# Patient Record
Sex: Female | Born: 1965 | Race: Black or African American | Hispanic: No | Marital: Single | Smoking: Current some day smoker
Health system: Southern US, Community
[De-identification: ages and names within clinical notes are randomized; demographics above are authoritative.]

## PROBLEM LIST (undated history)

## (undated) DIAGNOSIS — D649 Anemia, unspecified: Secondary | ICD-10-CM

## (undated) DIAGNOSIS — E785 Hyperlipidemia, unspecified: Secondary | ICD-10-CM

## (undated) DIAGNOSIS — M1990 Unspecified osteoarthritis, unspecified site: Secondary | ICD-10-CM

## (undated) DIAGNOSIS — K5792 Diverticulitis of intestine, part unspecified, without perforation or abscess without bleeding: Secondary | ICD-10-CM

## (undated) DIAGNOSIS — H269 Unspecified cataract: Secondary | ICD-10-CM

## (undated) DIAGNOSIS — K635 Polyp of colon: Secondary | ICD-10-CM

## (undated) HISTORY — PX: BACK SURGERY: SHX140

## (undated) HISTORY — PX: ABDOMINAL HYSTERECTOMY: SHX81

## (undated) HISTORY — DX: Unspecified cataract: H26.9

## (undated) HISTORY — PX: FOOT FRACTURE SURGERY: SHX645

## (undated) HISTORY — DX: Unspecified osteoarthritis, unspecified site: M19.90

## (undated) HISTORY — DX: Anemia, unspecified: D64.9

## (undated) HISTORY — DX: Hyperlipidemia, unspecified: E78.5

---

## 2016-02-18 ENCOUNTER — Emergency Department (HOSPITAL_COMMUNITY): Payer: Self-pay

## 2016-02-18 ENCOUNTER — Encounter (HOSPITAL_COMMUNITY): Payer: Self-pay

## 2016-02-18 ENCOUNTER — Emergency Department (HOSPITAL_COMMUNITY)
Admission: EM | Admit: 2016-02-18 | Discharge: 2016-02-18 | Disposition: A | Discharge status: Home / Self Care | Payer: Self-pay | Attending: Emergency Medicine | Admitting: Emergency Medicine

## 2016-02-18 DIAGNOSIS — Z86010 Personal history of colonic polyps: Secondary | ICD-10-CM | POA: Insufficient documentation

## 2016-02-18 DIAGNOSIS — F17210 Nicotine dependence, cigarettes, uncomplicated: Secondary | ICD-10-CM | POA: Insufficient documentation

## 2016-02-18 DIAGNOSIS — K5792 Diverticulitis of intestine, part unspecified, without perforation or abscess without bleeding: Secondary | ICD-10-CM | POA: Insufficient documentation

## 2016-02-18 DIAGNOSIS — K5900 Constipation, unspecified: Secondary | ICD-10-CM | POA: Insufficient documentation

## 2016-02-18 HISTORY — DX: Polyp of colon: K63.5

## 2016-02-18 LAB — CBC WITH DIFFERENTIAL/PLATELET
BASOS ABS: 0 10*3/uL (ref 0.0–0.1)
BASOS PCT: 0 %
Eosinophils Absolute: 0.2 10*3/uL (ref 0.0–0.7)
Eosinophils Relative: 1 %
HEMATOCRIT: 40.3 % (ref 36.0–46.0)
HEMOGLOBIN: 13.1 g/dL (ref 12.0–15.0)
LYMPHS PCT: 28 %
Lymphs Abs: 3.8 10*3/uL (ref 0.7–4.0)
MCH: 29 pg (ref 26.0–34.0)
MCHC: 32.5 g/dL (ref 30.0–36.0)
MCV: 89.4 fL (ref 78.0–100.0)
MONO ABS: 0.9 10*3/uL (ref 0.1–1.0)
Monocytes Relative: 7 %
NEUTROS ABS: 8.6 10*3/uL — AB (ref 1.7–7.7)
NEUTROS PCT: 64 %
Platelets: 385 10*3/uL (ref 150–400)
RBC: 4.51 MIL/uL (ref 3.87–5.11)
RDW: 13.9 % (ref 11.5–15.5)
WBC: 13.5 10*3/uL — ABNORMAL HIGH (ref 4.0–10.5)

## 2016-02-18 LAB — URINE MICROSCOPIC-ADD ON: WBC, UA: NONE SEEN WBC/hpf (ref 0–5)

## 2016-02-18 LAB — LIPASE, BLOOD: LIPASE: 28 U/L (ref 11–51)

## 2016-02-18 LAB — URINALYSIS, ROUTINE W REFLEX MICROSCOPIC
Bilirubin Urine: NEGATIVE
GLUCOSE, UA: NEGATIVE mg/dL
KETONES UR: NEGATIVE mg/dL
LEUKOCYTES UA: NEGATIVE
Nitrite: NEGATIVE
PH: 7 (ref 5.0–8.0)
PROTEIN: NEGATIVE mg/dL
Specific Gravity, Urine: 1.021 (ref 1.005–1.030)

## 2016-02-18 LAB — COMPREHENSIVE METABOLIC PANEL
ALBUMIN: 4.2 g/dL (ref 3.5–5.0)
ALK PHOS: 88 U/L (ref 38–126)
ALT: 23 U/L (ref 14–54)
AST: 24 U/L (ref 15–41)
Anion gap: 8 (ref 5–15)
BILIRUBIN TOTAL: 0.8 mg/dL (ref 0.3–1.2)
BUN: 6 mg/dL (ref 6–20)
CO2: 26 mmol/L (ref 22–32)
CREATININE: 0.63 mg/dL (ref 0.44–1.00)
Calcium: 9.9 mg/dL (ref 8.9–10.3)
Chloride: 102 mmol/L (ref 101–111)
GFR calc Af Amer: >60 mL/min (ref >60)
GLUCOSE: 105 mg/dL — AB (ref 65–99)
Potassium: 4.3 mmol/L (ref 3.5–5.1)
Sodium: 136 mmol/L (ref 135–145)
TOTAL PROTEIN: 8.2 g/dL — AB (ref 6.5–8.1)

## 2016-02-18 MED ORDER — MORPHINE SULFATE (PF) 4 MG/ML IV SOLN
4.0000 mg | Freq: Once | INTRAVENOUS | Status: AC
  Administered 2016-02-18: 4 mg via INTRAVENOUS
  Filled 2016-02-18: qty 1

## 2016-02-18 MED ORDER — HYDROCODONE-ACETAMINOPHEN 5-325 MG PO TABS: 1.0000 | ORAL_TABLET | Freq: Four times a day (QID) | ORAL | Status: DC | PRN

## 2016-02-18 MED ORDER — DIPHENHYDRAMINE HCL 50 MG/ML IJ SOLN
25.0000 mg | Freq: Once | INTRAMUSCULAR | Status: AC
  Administered 2016-02-18: 25 mg via INTRAVENOUS
  Filled 2016-02-18: qty 1

## 2016-02-18 MED ORDER — METRONIDAZOLE 500 MG PO TABS: 500.0000 mg | ORAL_TABLET | Freq: Three times a day (TID) | ORAL | Status: DC

## 2016-02-18 MED ORDER — ONDANSETRON HCL 4 MG PO TABS: 4.0000 mg | ORAL_TABLET | Freq: Four times a day (QID) | ORAL | Status: DC

## 2016-02-18 MED ORDER — METRONIDAZOLE 500 MG PO TABS
500.0000 mg | ORAL_TABLET | Freq: Once | ORAL | Status: AC
  Administered 2016-02-18: 500 mg via ORAL
  Filled 2016-02-18: qty 1

## 2016-02-18 MED ORDER — CIPROFLOXACIN HCL 500 MG PO TABS
500.0000 mg | ORAL_TABLET | Freq: Once | ORAL | Status: AC
  Administered 2016-02-18: 500 mg via ORAL
  Filled 2016-02-18: qty 1

## 2016-02-18 MED ORDER — IOPAMIDOL (ISOVUE-300) INJECTION 61%
75.0000 mL | Freq: Once | INTRAVENOUS | Status: AC | PRN
  Administered 2016-02-18: 75 mL via INTRAVENOUS

## 2016-02-18 MED ORDER — SODIUM CHLORIDE 0.9 % IV BOLUS (SEPSIS)
1000.0000 mL | Freq: Once | INTRAVENOUS | Status: AC
  Administered 2016-02-18: 1000 mL via INTRAVENOUS

## 2016-02-18 MED ORDER — CIPROFLOXACIN HCL 500 MG PO TABS: 500.0000 mg | ORAL_TABLET | Freq: Two times a day (BID) | ORAL | Status: DC

## 2016-02-18 MED FILL — ONDANSETRON HCL 4 MG TABLET: 4 | 3 days supply | Qty: 12 | Fill #0

## 2016-02-18 MED FILL — CIPROFLOXACIN HCL 500 MG TA: 500 | 10 days supply | Qty: 20 | Fill #0

## 2016-02-18 MED FILL — metroNIDAZOLE 500 MG TABS: 500 | 10 days supply | Qty: 30 | Fill #0

## 2016-02-18 NOTE — Discharge Instructions (Signed)
Diverticulitis °Diverticulitis is inflammation or infection of small pouches in your colon that form when you have a condition called diverticulosis. The pouches in your colon are called diverticula. Your colon, or large intestine, is where water is absorbed and stool is formed. °Complications of diverticulitis can include: °· Bleeding. °· Severe infection. °· Severe pain. °· Perforation of your colon. °· Obstruction of your colon. °CAUSES  °Diverticulitis is caused by bacteria. °Diverticulitis happens when stool becomes trapped in diverticula. This allows bacteria to grow in the diverticula, which can lead to inflammation and infection. °RISK FACTORS °People with diverticulosis are at risk for diverticulitis. Eating a diet that does not include enough fiber from fruits and vegetables may make diverticulitis more likely to develop. °SYMPTOMS  °Symptoms of diverticulitis may include: °· Abdominal pain and tenderness. The pain is normally located on the left side of the abdomen, but may occur in other areas. °· Fever and chills. °· Bloating. °· Cramping. °· Nausea. °· Vomiting. °· Constipation. °· Diarrhea. °· Blood in your stool. °DIAGNOSIS  °Your health care provider will ask you about your medical history and do a physical exam. You may need to have tests done because many medical conditions can cause the same symptoms as diverticulitis. Tests may include: °· Blood tests. °· Urine tests. °· Imaging tests of the abdomen, including X-rays and CT scans. °When your condition is under control, your health care provider may recommend that you have a colonoscopy. A colonoscopy can show how severe your diverticula are and whether something else is causing your symptoms. °TREATMENT  °Most cases of diverticulitis are mild and can be treated at home. Treatment may include: °· Taking over-the-counter pain medicines. °· Following a clear liquid diet. °· Taking antibiotic medicines by mouth for 7-10 days. °More severe cases may  be treated at a hospital. Treatment may include: °· Not eating or drinking. °· Taking prescription pain medicine. °· Receiving antibiotic medicines through an IV tube. °· Receiving fluids and nutrition through an IV tube. °· Surgery. °HOME CARE INSTRUCTIONS  °· Follow your health care provider's instructions carefully. °· Follow a full liquid diet or other diet as directed by your health care provider. After your symptoms improve, your health care provider may tell you to change your diet. He or she may recommend you eat a high-fiber diet. Fruits and vegetables are good sources of fiber. Fiber makes it easier to pass stool. °· Take fiber supplements or probiotics as directed by your health care provider. °· Only take medicines as directed by your health care provider. °· Keep all your follow-up appointments. °SEEK MEDICAL CARE IF:  °· Your pain does not improve. °· You have a hard time eating food. °· Your bowel movements do not return to normal. °SEEK IMMEDIATE MEDICAL CARE IF:  °· Your pain becomes worse. °· Your symptoms do not get better. °· Your symptoms suddenly get worse. °· You have a fever. °· You have repeated vomiting. °· You have bloody or black, tarry stools. °MAKE SURE YOU:  °· Understand these instructions. °· Will watch your condition. °· Will get help right away if you are not doing well or get worse. °  °This information is not intended to replace advice given to you by your health care provider. Make sure you discuss any questions you have with your health care provider. °  °Document Released: 06/24/2005 Document Revised: 09/19/2013 Document Reviewed: 08/09/2013 °Elsevier Interactive Patient Education ©2016 [employer] ° °

## 2016-02-18 NOTE — ED Notes (Signed)
Pt. Presents with complaint of L lower abd pain. Pt. States she was feeling constipated so took milk of magnesium x 2. Last bowel movement today. Pt. Denies N/V. States hx of same.

## 2016-02-18 NOTE — ED Provider Notes (Signed)
CSN: [id number]     Arrival date & time 02/18/16  [id number] History   First MD Initiated Contact with Patient 02/18/16 1035     Chief Complaint  Patient presents with  . Abdominal Pain   Patient is a 50 y.o. female presenting with abdominal pain.  Abdominal Pain Pain location:  LLQ Pain quality: aching, bloating and sharp   Pain radiates to:  Does not radiate Pain severity:  Moderate Onset quality:  Gradual Duration:  1 week Progression:  Worsening Chronicity:  Recurrent Relieved by:  Nothing Worsened by:  Movement Ineffective treatments: Milk of magnesia. Associated symptoms: constipation   Associated symptoms: no chills, no diarrhea, no dysuria, no fever, no hematochezia, no hematuria, no melena, no nausea, no vaginal discharge and no vomiting    Ms. Tatlock is a 50 year old female with PMHx of colon polyps presenting with abdominal pain. Onset of symptoms was one week ago. She complains of pain in the left lower quadrant that she describes as aching with intermittent sharp and shooting pain. She states the pain is constant and seems to be worsening in severity. The pain is worsened by certain movements and palpation. She denies alleviating factors. She initially felt constipated but took milk of magnesia which relieved this. This has not improved her abdominal pain. Denies blood in her stool. She reports a history of similar abdominal pain for which she took natural herbs which relieved the symptoms. She is concerned that her polyp is infected. She states that she had colonoscopy a few years ago and was told she had an large polyp that was removed. She believes that the site of the polyp removal is what is infected currently. Denies associated symptoms of fever, chills, nausea, vomiting, dysuria, hematuria or vaginal discharge. She has no other complaints today.   Past Medical History  Diagnosis Date  . Polyp of colon    Past Surgical History  Procedure Laterality Date  . Back surgery     2001  . Abdominal hysterectomy      partial  . Foot fracture surgery     No family history on file. Social History  Substance Use Topics  . Smoking status: Current Every Day Smoker -- 0.50 packs/day    Types: Cigarettes  . Smokeless tobacco: None  . Alcohol Use: Yes     Comment: socially   OB History    No data available     Review of Systems  Constitutional: Negative for fever and chills.  Gastrointestinal: Positive for abdominal pain and constipation. Negative for nausea, vomiting, diarrhea, melena and hematochezia.  Genitourinary: Negative for dysuria, hematuria and vaginal discharge.  All other systems reviewed and are negative.     Allergies  Review of patient's allergies indicates no known allergies.  Home Medications   Prior to Admission medications   Medication Sig Start Date End Date Taking? Authorizing Provider  ciprofloxacin (CIPRO) 500 MG tablet Take 1 tablet (500 mg total) by mouth 2 (two) times daily. 02/18/16   Ilina Xu, PA-C  HYDROcodone-acetaminophen (NORCO/VICODIN) 5-325 MG tablet Take 1 tablet by mouth every 6 (six) hours as needed. 02/18/16   Ansen Sayegh, PA-C  magnesium hydroxide (MILK OF MAGNESIA) 400 MG/5ML suspension Take 5 mLs by mouth daily as needed for mild constipation.   Yes Historical Provider, MD  metroNIDAZOLE (FLAGYL) 500 MG tablet Take 1 tablet (500 mg total) by mouth 3 (three) times daily. 02/18/16   Caralynn Gelber, PA-C  naproxen sodium (ANAPROX) 220 MG tablet Take 440  mg by mouth 2 (two) times daily as needed. For pain   Yes Historical Provider, MD  ondansetron (ZOFRAN) 4 MG tablet Take 1 tablet (4 mg total) by mouth every 6 (six) hours. 02/18/16   Ramia Sidney, PA-C   BP 100/69 mmHg  Pulse 78  Temp(Src) 98.9 F (37.2 C) (Oral)  Resp 18  SpO2 98% Physical Exam  Constitutional: She appears well-developed and well-nourished. No distress.  Nontoxic-appearing  HENT:  Head: Normocephalic and atraumatic.  Eyes: Conjunctivae are  normal. Right eye exhibits no discharge. Left eye exhibits no discharge. No scleral icterus.  Neck: Normal range of motion.  Cardiovascular: Normal rate, regular rhythm and normal heart sounds.   Pulmonary/Chest: Effort normal and breath sounds normal. No respiratory distress.  Abdominal: Soft. Bowel sounds are normal. There is tenderness in the left lower quadrant. There is no rebound and no guarding.  Musculoskeletal: Normal range of motion.  Neurological: She is alert. Coordination normal.  Skin: Skin is warm and dry.  Psychiatric: She has a normal mood and affect. Her behavior is normal.  Nursing note and vitals reviewed.   ED Course  Procedures (including critical care time) Labs Review Labs Reviewed  CBC WITH DIFFERENTIAL/PLATELET - Abnormal; Notable for the following:    WBC 13.5 (*)    Neutro Abs 8.6 (*)    All other components within normal limits  COMPREHENSIVE METABOLIC PANEL - Abnormal; Notable for the following:    Glucose, Bld 105 (*)    Total Protein 8.2 (*)    All other components within normal limits  URINALYSIS, ROUTINE W REFLEX MICROSCOPIC (NOT AT [facility]) - Abnormal; Notable for the following:    APPearance HAZY (*)    Hgb urine dipstick SMALL (*)    All other components within normal limits  URINE MICROSCOPIC-ADD ON - Abnormal; Notable for the following:    Squamous Epithelial / LPF 0-5 (*)    Bacteria, UA RARE (*)    All other components within normal limits  LIPASE, BLOOD    Imaging Review Ct Abdomen Pelvis W Contrast  02/18/2016  CLINICAL DATA:  Left upper/left lower quadrant pain with nausea for 1 week. History of diverticulitis. EXAM: CT ABDOMEN AND PELVIS WITH CONTRAST TECHNIQUE: Multidetector CT imaging of the abdomen and pelvis was performed using the standard protocol following bolus administration of intravenous contrast. CONTRAST:  78mL ISOVUE-300 IOPAMIDOL (ISOVUE-300) INJECTION 61% COMPARISON:  None. FINDINGS: Minimal dependent atelectasis is noted  in the lung bases. There is no pleural effusion. The liver, gallbladder, spleen, right adrenal gland, left kidney, and pancreas have an unremarkable enhanced appearance. There is mild-to-moderate left adrenal gland thickening. 5 mm hypodensity in the lower pole of the right kidney is too small to fully characterize. There is no evidence of bowel obstruction. The appendix is unremarkable. There is left-sided colonic diverticulosis with mild pericolonic stranding about the mid and distal descending and proximal sigmoid colon consistent with mild acute diverticulitis. There is no evidence of perforation or abscess. Bladder is unremarkable. Uterus is absent. Bilateral tubal ligation clips are noted. Ovaries are grossly unremarkable. Abdominal aorta is normal in caliber with mild atherosclerotic calcification noted. No enlarged lymph nodes are identified. Trace free fluid is noted in the left paracolic gutter and pelvis. There is transitional lumbosacral anatomy. The last pair of ribs is hypoplastic and will be designated T12, and the transitional segment will be considered a sacralized L5 with rudimentary L5-S1 disc. Schmorl's node deformities/ central superior endplate compression fractures are present at T10,  T11, and T12. Height loss is greatest at T12 where it measures approximately 50%. Prominent sclerotic and mild cystic degenerative endplate changes are noted at T9-10. IMPRESSION: 1. Mild acute diverticulitis involving the distal descending colon. No evidence of abscess or other complication. 2. Lower thoracic Schmorl's node deformities/mild compression fractures, favored to be chronic though no prior studies are available for comparison. Electronically Signed   By: Logan Bores M.D.   On: 02/18/2016 13:03   I have personally reviewed and evaluated these images and lab results as part of my medical decision-making.   EKG Interpretation None      MDM   Final diagnoses:  Acute diverticulitis   50 year  old female presenting with LLQ abdominal pain x 1 week. VSS. Patient is nontoxic, nonseptic appearing, in no apparent distress. Abdomen is soft with tenderness in LLQ. No peritoneal signs suggesting surgical abdomen. Patient's pain and other symptoms adequately managed in emergency department. Fluid bolus given. Labs, imaging and vitals reviewed. Leukocytosis to 13.5. Abd CT positive for acute mild diverticulitis without complication. Incidentally noted chronic lumbar degernative changes. Pt denies current back pain. Patient is hemodynamically stable and does not meet SIRS criteria. Pt appropriate for outpatient treatment. Patient discharged home with cipro, flagyl and symptomatic treatment and given instructions for follow-up with GI.  I have also discussed reasons to return immediately to the ER.  Patient expresses understanding and agrees with plan.      Lahoma Crocker Haidyn Chadderdon, PA-C 02/18/16 1342  Lacretia Leigh, MD 02/18/16 [phone]

## 2016-02-21 ENCOUNTER — Telehealth: Payer: Self-pay | Admitting: General Practice

## 2016-03-06 ENCOUNTER — Ambulatory Visit: Payer: Self-pay | Admitting: Family Medicine

## 2017-06-04 ENCOUNTER — Emergency Department (HOSPITAL_COMMUNITY): Payer: Self-pay

## 2017-06-04 ENCOUNTER — Encounter (HOSPITAL_COMMUNITY): Payer: Self-pay

## 2017-06-04 ENCOUNTER — Emergency Department (HOSPITAL_COMMUNITY)
Admission: EM | Admit: 2017-06-04 | Discharge: 2017-06-04 | Disposition: A | Discharge status: Home / Self Care | Payer: Self-pay | Attending: Emergency Medicine | Admitting: Emergency Medicine

## 2017-06-04 DIAGNOSIS — M5431 Sciatica, right side: Secondary | ICD-10-CM | POA: Insufficient documentation

## 2017-06-04 DIAGNOSIS — F17210 Nicotine dependence, cigarettes, uncomplicated: Secondary | ICD-10-CM | POA: Insufficient documentation

## 2017-06-04 MED ORDER — METHYLPREDNISOLONE 4 MG PO TBPK: ORAL_TABLET | ORAL | 0 refills | Status: DC

## 2017-06-04 MED ORDER — KETOROLAC TROMETHAMINE 60 MG/2ML IM SOLN
30.0000 mg | Freq: Once | INTRAMUSCULAR | Status: AC
  Administered 2017-06-04: 30 mg via INTRAMUSCULAR
  Filled 2017-06-04: qty 2

## 2017-06-04 MED ORDER — CYCLOBENZAPRINE HCL 10 MG PO TABS: 10.0000 mg | ORAL_TABLET | Freq: Two times a day (BID) | ORAL | 0 refills | Status: DC | PRN

## 2017-06-04 NOTE — ED Provider Notes (Signed)
[facility] DEPT Provider Note   CSN: [id number] Arrival date & time: 06/04/17  1432     History   Chief Complaint Chief Complaint  Patient presents with  . Hip Pain    HPI Audrey Beck is a 51 y.o. female.  HPI Patient, with history of back surgery approximately 17 years ago, presents to ED for 2 day history of right-sided hip pain. Describes the pain is throbbing and states that it radiates down her right leg. 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 She has tried Tylenol and ibuprofen with no relief in her symptoms. She tried Epsom salt bath with some improvement in symptoms. She reports no history of similar symptoms. Maintains ambulatory. Denies any numbness, weakness, back pain, urinary incontinence, injuries or falls.  Past Medical History:  Diagnosis Date  . Polyp of colon     There are no active problems to display for this patient.   Past Surgical History:  Procedure Laterality Date  . ABDOMINAL HYSTERECTOMY     partial  . BACK SURGERY     2001  . FOOT FRACTURE SURGERY      OB History    No data available       Home Medications    Prior to Admission medications   Medication Sig Start Date End Date Taking? Authorizing Provider  ciprofloxacin (CIPRO) 500 MG tablet Take 1 tablet (500 mg total) by mouth 2 (two) times daily. 02/18/16   Barrett, Lahoma Crocker, PA-C  cyclobenzaprine (FLEXERIL) 10 MG tablet Take 1 tablet (10 mg total) by mouth 2 (two) times daily as needed for muscle spasms. 06/04/17   Dalanie Kisner, PA-C  HYDROcodone-acetaminophen (NORCO/VICODIN) 5-325 MG tablet Take 1 tablet by mouth every 6 (six) hours as needed. 02/18/16   Barrett, Lahoma Crocker, PA-C  magnesium hydroxide (MILK OF MAGNESIA) 400 MG/5ML suspension Take 5 mLs by mouth daily as needed for mild constipation.    [provider]  methylPREDNISolone (MEDROL DOSEPAK) 4 MG TBPK tablet Taper over 6 days. 06/04/17   Socorro Kanitz, PA-C  metroNIDAZOLE  (FLAGYL) 500 MG tablet Take 1 tablet (500 mg total) by mouth 3 (three) times daily. 02/18/16   Barrett, Lahoma Crocker, PA-C  naproxen sodium (ANAPROX) 220 MG tablet Take 440 mg by mouth 2 (two) times daily as needed. For pain    [provider]  ondansetron (ZOFRAN) 4 MG tablet Take 1 tablet (4 mg total) by mouth every 6 (six) hours. 02/18/16   Barrett, Lahoma Crocker, PA-C    Family History History reviewed. No pertinent family history.  Social History Social History  Substance Use Topics  . Smoking status: Current Every Day Smoker    Packs/day: 0.50    Types: Cigarettes  . Smokeless tobacco: Not on file  . Alcohol use Yes     Comment: socially     Allergies   Patient has no known allergies.   Review of Systems Review of Systems  Constitutional: Negative for chills and fever.  Gastrointestinal: Negative for nausea and vomiting.  Genitourinary: Negative for difficulty urinating.  Musculoskeletal: Positive for arthralgias. Negative for back pain, gait problem, joint swelling and myalgias.  Skin: Negative for color change and rash.     Physical Exam Updated Vital Signs BP 98/72   Pulse 82   Temp 98.3 F (36.8 C) (Oral)   Resp 16   Ht 5' 6.5" (1.689 m)   Wt 81.6 kg (180 lb)   SpO2 99%  BMI 28.62 kg/m   Physical Exam  Constitutional: She appears well-developed and well-nourished. No distress.  HENT:  Head: Normocephalic and atraumatic.  Eyes: Conjunctivae and EOM are normal. No scleral icterus.  Neck: Normal range of motion.  Pulmonary/Chest: Effort normal. No respiratory distress.  Musculoskeletal: Normal range of motion. She exhibits tenderness. She exhibits no edema or deformity.       Arms: Tenderness to palpation of the right hip at the indicated area. No midline spinal tenderness present in lumbar, thoracic or cervical spine. No step-off palpated. No visible bruising, edema or temperature change noted. No objective signs of numbness present. No saddle anesthesia. 2+  DP pulses bilaterally. Sensation intact to light touch. Strength 5/5 in bilateral lower extremities.  Neurological: She is alert.  Skin: No rash noted. She is not diaphoretic.  Psychiatric: She has a normal mood and affect.  Nursing note and vitals reviewed.    ED Treatments / Results  Labs (all labs ordered are listed, but only abnormal results are displayed) Labs Reviewed - No data to display  EKG  EKG Interpretation None       Radiology Dg Hip Unilat W Or Wo Pelvis 2-3 Views Right  Result Date: 06/04/2017 CLINICAL DATA:  Right hip pain for 4 days.  No known injury. EXAM: DG HIP (WITH OR WITHOUT PELVIS) 2-3V RIGHT COMPARISON:  Reformat stoma abdominal CT 02/18/2016 FINDINGS: The cortical margins of the bony pelvis are intact. No fracture. There is mild degenerative change of both sacroiliac joints which are congruent. Mild degenerative change of both hips with acetabular spurring, femoral head neck osteophytes and small osseous bump at the femoral head neck junction. Both femoral heads are well-seated in the respective acetabula. No evidence of focal osseous lesion. IMPRESSION: 1. No acute abnormality of the pelvis or right hip. 2. 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 Electronically Signed   By: Jeb Levering M.D.   On: 06/04/2017 19:20    Procedures Procedures (including critical care time)  Medications Ordered in ED Medications  ketorolac (TORADOL) injection 30 mg (30 mg Intramuscular Given 06/04/17 1919)     Initial Impression / Assessment and Plan / ED Course  I have reviewed the triage vital signs and the nursing notes.  Pertinent labs & imaging results that were available during my care of the patient were reviewed by me and considered in my medical decision making (see chart for details).     Patient presents to ED for evaluation of right hip pain for the past 2 days radiating down  right leg. She did have a history of back surgery approximately 17 years ago. 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 On physical exam there is tenderness to palpation of the right hip patient is otherwise nontoxic appearing and in no acute distress. She has no midline spinal tenderness present. She is ambulatory here in the ED. I have low suspicion for cauda equina or other acute spinal cord injury being the cause of her back pain based on the lack of neurological findings including no numbness, saddle anesthesia. X-ray of hip returned as negative and showed degenerative changes. Patient reports improvement in symptoms with Toradol given here in the ED. We will discharge with steroid taper pack and muscle relaxer as this is likely due to sciatica. Patient appears stable for discharge at this time. She'll return precautions given.  Final Clinical Impressions(s) /  ED Diagnoses   Final diagnoses:  Sciatica of right side    New Prescriptions New Prescriptions   CYCLOBENZAPRINE (FLEXERIL) 10 MG TABLET    Take 1 tablet (10 mg total) by mouth 2 (two) times daily as needed for muscle spasms.   METHYLPREDNISOLONE (MEDROL DOSEPAK) 4 MG TBPK TABLET    Taper over 6 days.     Delia Heady, PA-C 06/04/17 1948    Dorie Rank, MD 06/04/17 [phone]

## 2017-06-04 NOTE — ED Triage Notes (Signed)
Pt endorses right hip pain x 2 days, denies injury. Pt has hx of back problems. VSS

## 2017-06-04 NOTE — Discharge Instructions (Signed)
Please read attached information regarding your condition. Take steroids as directed in tapered dose. Take Flexeril as needed for stiffness and spasms. Return to ED for severe back pain, trouble walking, numbness, weakness, loss of bladder function, injuries or falls.

## 2017-11-22 ENCOUNTER — Ambulatory Visit (INDEPENDENT_AMBULATORY_CARE_PROVIDER_SITE_OTHER): Payer: Medicaid Other | Admitting: Physician Assistant

## 2017-11-22 ENCOUNTER — Encounter (INDEPENDENT_AMBULATORY_CARE_PROVIDER_SITE_OTHER): Payer: Self-pay | Admitting: Physician Assistant

## 2017-11-22 ENCOUNTER — Other Ambulatory Visit (HOSPITAL_COMMUNITY)
Admission: RE | Admit: 2017-11-22 | Discharge: 2017-11-22 | Disposition: A | Discharge status: Home / Self Care | Payer: Medicaid Other | Source: Ambulatory Visit | Attending: Physician Assistant | Admitting: Physician Assistant

## 2017-11-22 VITALS — BP 104/69 | HR 102 | Temp 98.0°F | Resp 18 | Ht 66.0 in | Wt 170.0 lb

## 2017-11-22 DIAGNOSIS — Z309 Encounter for contraceptive management, unspecified: Secondary | ICD-10-CM

## 2017-11-22 DIAGNOSIS — Z124 Encounter for screening for malignant neoplasm of cervix: Secondary | ICD-10-CM

## 2017-11-22 DIAGNOSIS — M6740 Ganglion, unspecified site: Secondary | ICD-10-CM

## 2017-11-22 DIAGNOSIS — K089 Disorder of teeth and supporting structures, unspecified: Secondary | ICD-10-CM

## 2017-11-22 DIAGNOSIS — Z0000 Encounter for general adult medical examination without abnormal findings: Secondary | ICD-10-CM

## 2017-11-22 DIAGNOSIS — Z1159 Encounter for screening for other viral diseases: Secondary | ICD-10-CM

## 2017-11-22 DIAGNOSIS — Z114 Encounter for screening for human immunodeficiency virus [HIV]: Secondary | ICD-10-CM

## 2017-11-22 DIAGNOSIS — Z23 Encounter for immunization: Secondary | ICD-10-CM | POA: Diagnosis not present

## 2017-11-22 DIAGNOSIS — Z1239 Encounter for other screening for malignant neoplasm of breast: Secondary | ICD-10-CM

## 2017-11-22 DIAGNOSIS — Z1211 Encounter for screening for malignant neoplasm of colon: Secondary | ICD-10-CM

## 2017-11-22 MED ORDER — ACETAMINOPHEN 500 MG PO TABS: 500.0000 mg | ORAL_TABLET | Freq: Three times a day (TID) | ORAL | 0 refills | Status: DC | PRN

## 2017-11-22 NOTE — Progress Notes (Signed)
Subjective:  Patient ID: Audrey Beck, female    DOB: 1966/01/20  Age: 52 y.o. MRN: [id number]  CC: cts  HPI Audrey Beck is a RHD 52 y.o. female with a medical history of diverticulitis, right sided sciatica, and colon polyp presents as a new patient with complaint of left wrist bump since two months ago. Had several different occurrences and they resolved by hitting the bump flat. Pain is 7/10 on the current bump. Wears a wrist band to help with the pain. Would like a general exam as she has not had a PCP in approximately 3-4 years. Does not endorse any other symptoms or complaints.     Outpatient Medications Prior to Visit  Medication Sig Dispense Refill  . cyclobenzaprine (FLEXERIL) 10 MG tablet Take 1 tablet (10 mg total) by mouth 2 (two) times daily as needed for muscle spasms. 20 tablet 0  . naproxen sodium (ANAPROX) 220 MG tablet Take 440 mg by mouth 2 (two) times daily as needed. For pain    . ciprofloxacin (CIPRO) 500 MG tablet Take 1 tablet (500 mg total) by mouth 2 (two) times daily. 20 tablet 0  . HYDROcodone-acetaminophen (NORCO/VICODIN) 5-325 MG tablet Take 1 tablet by mouth every 6 (six) hours as needed. 12 tablet 0  . magnesium hydroxide (MILK OF MAGNESIA) 400 MG/5ML suspension Take 5 mLs by mouth daily as needed for mild constipation.    . methylPREDNISolone (MEDROL DOSEPAK) 4 MG TBPK tablet Taper over 6 days. 21 tablet 0  . metroNIDAZOLE (FLAGYL) 500 MG tablet Take 1 tablet (500 mg total) by mouth 3 (three) times daily. 30 tablet 0  . ondansetron (ZOFRAN) 4 MG tablet Take 1 tablet (4 mg total) by mouth every 6 (six) hours. 12 tablet 0   No facility-administered medications prior to visit.      ROS Review of Systems  Constitutional: Negative for chills, fever and malaise/fatigue.  HENT:       Poor dentition  Eyes: Negative for blurred vision.  Respiratory: Negative for shortness of breath.   Cardiovascular: Negative for chest pain and palpitations.   Gastrointestinal: Negative for abdominal pain and nausea.  Genitourinary: Negative for dysuria and hematuria.  Musculoskeletal: Positive for joint pain. Negative for myalgias.  Skin: Negative for rash.  Neurological: Negative for tingling and headaches.  Psychiatric/Behavioral: Negative for depression. The patient is not nervous/anxious.     Objective:  BP 104/69 (BP Location: Left Arm, Patient Position: Sitting, Cuff Size: Normal)   Pulse (!) 102   Temp 98 F (36.7 C) (Oral)   Resp 18   Ht 5\' 6"  (1.676 m)   Wt 170 lb (77.1 kg)   SpO2 98%   BMI 27.44 kg/m   BP/Weight 11/22/2017 06/04/2017 05/07/1750  Systolic BP 025 98 852  Diastolic BP 69 72 69  Wt. (Lbs) 170 180 -  BMI 27.44 28.62 -      Physical Exam  Constitutional: She is oriented to person, place, and time.  Well developed, well nourished, NAD, polite  HENT:  Head: Normocephalic and atraumatic.  Missing several teeth. Decayed teeth, Periodontal disease.  Eyes: Conjunctivae and EOM are normal. Pupils are equal, round, and reactive to light. No scleral icterus.  Neck: Normal range of motion. Neck supple. No thyromegaly present.  Cardiovascular: Normal rate, regular rhythm and normal heart sounds. Exam reveals no gallop and no friction rub.  No murmur heard. No carotic bruits. No LE edema bilaterally  Pulmonary/Chest: Effort normal and breath sounds normal. No respiratory distress.  She has no wheezes. She has no rales.  Abdominal: Soft. Bowel sounds are normal. There is no tenderness.  Genitourinary:  Genitourinary Comments: No vaginal discharge. Cervix absent. No adnexal tenderness or mass bilaterally. Uterus absent.  Musculoskeletal: Normal range of motion. She exhibits no edema.  Left wrist with a tender subcentimeter nodule that is freely movable and rubbery to the touch.  Lymphadenopathy:    She has no cervical adenopathy.  Neurological: She is alert and oriented to person, place, and time. She has normal  reflexes. No cranial nerve deficit. Coordination normal.  Skin: Skin is warm and dry. No rash noted. No erythema. No pallor.  Psychiatric: She has a normal mood and affect. Her behavior is normal. Thought content normal.  Vitals reviewed.    Assessment & Plan:    1. Annual physical exam - CBC with Differential - Comprehensive metabolic panel - TSH - Lipid panel; Future  2. Ganglion cyst - Pt will obtain orange card and [employer] financial assistance before I can refer.  - Begin Tylenol 500 mg TID #60, can alternate with OTC Aleve  3. Screening for HIV (human immunodeficiency virus) - HIV antibody  4. Encounter for hepatitis C screening test for low risk patient - Hepatitis c antibody (reflex)  5. Screening for breast cancer - MM DIGITAL SCREENING BILATERAL; Future  6. Screening for colon cancer - Fecal occult blood, imunochemical  7. Screening for cervical cancer - Cytology - PAP([place])  8. Need for prophylactic vaccination and inoculation against influenza - Flu Vaccine QUAD 6+ mos PF IM (Fluarix Quad PF)  9. Poor dentition - Pt will obtain orange card and [employer] financial assistance before I can refer.     Follow-up: Return if symptoms worsen or fail to improve.   Clent Demark PA

## 2017-11-22 NOTE — Patient Instructions (Signed)
Ganglion Cyst A ganglion cyst is a noncancerous, fluid-filled lump that occurs near joints or tendons. The ganglion cyst grows out of a joint or the lining of a tendon. It most often develops in the hand or wrist, but it can also develop in the shoulder, elbow, hip, knee, ankle, or foot. The round or oval ganglion cyst can be the size of a pea or larger than a grape. Increased activity may enlarge the size of the cyst because more fluid starts to build up. What are the causes? It is not known what causes a ganglion cyst to grow. However, it may be related to:  Inflammation or irritation around the joint.  An injury.  Repetitive movements or overuse.  Arthritis.  What increases the risk? Risk factors include:  Being a woman.  Being age 20-50.  What are the signs or symptoms? Symptoms may include:  A lump. This most often appears on the hand or wrist, but it can occur in other areas of the body.  Tingling.  Pain.  Numbness.  Muscle weakness.  Weak grip.  Less movement in a joint.  How is this diagnosed? Ganglion cysts are most often diagnosed based on a physical exam. Your health care provider will feel the lump and may shine a light alongside it. If it is a ganglion cyst, a light often shines through it. Your health care provider may order an X-ray, ultrasound, or MRI to rule out other conditions. How is this treated? Ganglion cysts usually go away on their own without treatment. If pain or other symptoms are involved, treatment may be needed. Treatment is also needed if the ganglion cyst limits your movement or if it gets infected. Treatment may include:  Wearing a brace or splint on your wrist or finger.  Taking anti-inflammatory medicine.  Draining fluid from the lump with a needle (aspiration).  Injecting a steroid into the joint.  Surgery to remove the ganglion cyst.  Follow these instructions at home:  Do not press on the ganglion cyst, poke it with a  needle, or hit it.  Take medicines only as directed by your health care provider.  Wear your brace or splint as directed by your health care provider.  Watch your ganglion cyst for any changes.  Keep all follow-up visits as directed by your health care provider. This is important. Contact a health care provider if:  Your ganglion cyst becomes larger or more painful.  You have increased redness, red streaks, or swelling.  You have pus coming from the lump.  You have weakness or numbness in the affected area.  You have a fever or chills. This information is not intended to replace advice given to you by your health care provider. Make sure you discuss any questions you have with your health care provider. Document Released: 09/11/2000 Document Revised: 02/20/2016 Document Reviewed: 02/27/2014 Elsevier Interactive Patient Education  2018 Elsevier Inc.  

## 2017-11-23 ENCOUNTER — Telehealth (INDEPENDENT_AMBULATORY_CARE_PROVIDER_SITE_OTHER): Payer: Self-pay | Admitting: *Deleted

## 2017-11-23 ENCOUNTER — Other Ambulatory Visit (INDEPENDENT_AMBULATORY_CARE_PROVIDER_SITE_OTHER): Payer: Self-pay | Admitting: Physician Assistant

## 2017-11-23 DIAGNOSIS — E7841 Elevated Lipoprotein(a): Secondary | ICD-10-CM

## 2017-11-23 LAB — CBC WITH DIFFERENTIAL/PLATELET
Basophils Absolute: 0 10*3/uL (ref 0.0–0.2)
Basos: 0 %
EOS (ABSOLUTE): 0.2 10*3/uL (ref 0.0–0.4)
EOS: 1 %
HEMATOCRIT: 39.2 % (ref 34.0–46.6)
HEMOGLOBIN: 12.7 g/dL (ref 11.1–15.9)
IMMATURE GRANS (ABS): 0 10*3/uL (ref 0.0–0.1)
Immature Granulocytes: 0 %
Lymphocytes Absolute: 4.2 10*3/uL — ABNORMAL HIGH (ref 0.7–3.1)
Lymphs: 35 %
MCH: 30.5 pg (ref 26.6–33.0)
MCHC: 32.4 g/dL (ref 31.5–35.7)
MCV: 94 fL (ref 79–97)
MONOCYTES: 6 %
Monocytes Absolute: 0.8 10*3/uL (ref 0.1–0.9)
Neutrophils Absolute: 6.9 10*3/uL (ref 1.4–7.0)
Neutrophils: 58 %
Platelets: 454 10*3/uL — ABNORMAL HIGH (ref 150–379)
RBC: 4.17 x10E6/uL (ref 3.77–5.28)
RDW: 13.8 % (ref 12.3–15.4)
WBC: 12.1 10*3/uL — AB (ref 3.4–10.8)

## 2017-11-23 LAB — COMPREHENSIVE METABOLIC PANEL
ALBUMIN: 4.5 g/dL (ref 3.5–5.5)
ALT: 17 IU/L (ref 0–32)
AST: 17 IU/L (ref 0–40)
Albumin/Globulin Ratio: 1.5 (ref 1.2–2.2)
Alkaline Phosphatase: 85 IU/L (ref 39–117)
BUN / CREAT RATIO: 17 (ref 9–23)
BUN: 12 mg/dL (ref 6–24)
Bilirubin Total: 0.4 mg/dL (ref 0.0–1.2)
CALCIUM: 9.2 mg/dL (ref 8.7–10.2)
CO2: 22 mmol/L (ref 20–29)
Chloride: 101 mmol/L (ref 96–106)
Creatinine, Ser: 0.7 mg/dL (ref 0.57–1.00)
GFR, EST AFRICAN AMERICAN: 114 mL/min/{1.73_m2} (ref >59)
GFR, EST NON AFRICAN AMERICAN: 99 mL/min/{1.73_m2} (ref >59)
GLOBULIN, TOTAL: 3 g/dL (ref 1.5–4.5)
Glucose: 89 mg/dL (ref 65–99)
Potassium: 4.1 mmol/L (ref 3.5–5.2)
SODIUM: 138 mmol/L (ref 134–144)
TOTAL PROTEIN: 7.5 g/dL (ref 6.0–8.5)

## 2017-11-23 LAB — CYTOLOGY - PAP
BACTERIAL VAGINITIS: NEGATIVE
CHLAMYDIA, DNA PROBE: NEGATIVE
Candida vaginitis: NEGATIVE
DIAGNOSIS: NEGATIVE
NEISSERIA GONORRHEA: NEGATIVE
Trichomonas: NEGATIVE

## 2017-11-23 LAB — LIPID PANEL
CHOL/HDL RATIO: 3.6 ratio (ref 0.0–4.4)
Cholesterol, Total: 229 mg/dL — ABNORMAL HIGH (ref 100–199)
HDL: 63 mg/dL (ref >39)
LDL Calculated: 131 mg/dL — ABNORMAL HIGH (ref 0–99)
Triglycerides: 174 mg/dL — ABNORMAL HIGH (ref 0–149)
VLDL CHOLESTEROL CAL: 35 mg/dL (ref 5–40)

## 2017-11-23 LAB — HCV COMMENT:

## 2017-11-23 LAB — HIV ANTIBODY (ROUTINE TESTING W REFLEX): HIV Screen 4th Generation wRfx: NONREACTIVE

## 2017-11-23 LAB — HEPATITIS C ANTIBODY (REFLEX): HCV Ab: <0.1 s/co ratio (ref 0.0–0.9)

## 2017-11-23 LAB — TSH: TSH: 0.592 u[IU]/mL (ref 0.450–4.500)

## 2017-11-23 MED ORDER — LOVASTATIN 40 MG PO TABS: 40.0000 mg | ORAL_TABLET | Freq: Every day | ORAL | 3 refills | Status: DC

## 2017-11-23 NOTE — Telephone Encounter (Signed)
-----   Message from Clent Demark, PA-C sent at 11/23/2017  8:37 AM EST ----- Cholesterol elevated and white blood cells mildly elevated. I will send statin to [facility] in [place]. Please have her return in one to two weeks for work up of mildly elevated WBCs.

## 2017-11-23 NOTE — Telephone Encounter (Signed)
Medical Assistant left message on patient's home and cell voicemail. Voicemail states to give a call back to [place] with [facility] at [phone]. !!!Please inform patient of cholesterol being elevated and White Blood cells being elevated mildly. Lovastatin being sent to [facility]. Please schedule for one/two week recheck for WBC.!!!

## 2017-11-25 ENCOUNTER — Other Ambulatory Visit (INDEPENDENT_AMBULATORY_CARE_PROVIDER_SITE_OTHER): Payer: Self-pay

## 2017-11-25 DIAGNOSIS — Z0000 Encounter for general adult medical examination without abnormal findings: Secondary | ICD-10-CM

## 2017-11-25 NOTE — Progress Notes (Signed)
Patient aware of other results and medication at [place]. Patient scheduled 2 week FU to address WBC

## 2017-11-26 ENCOUNTER — Telehealth (INDEPENDENT_AMBULATORY_CARE_PROVIDER_SITE_OTHER): Payer: Self-pay | Admitting: *Deleted

## 2017-11-26 LAB — LIPID PANEL
CHOL/HDL RATIO: 3.6 ratio (ref 0.0–4.4)
CHOLESTEROL TOTAL: 229 mg/dL — AB (ref 100–199)
HDL: 63 mg/dL (ref >39)
LDL CALC: 146 mg/dL — AB (ref 0–99)
TRIGLYCERIDES: 98 mg/dL (ref 0–149)
VLDL Cholesterol Cal: 20 mg/dL (ref 5–40)

## 2017-11-26 LAB — FECAL OCCULT BLOOD, IMMUNOCHEMICAL: Fecal Occult Bld: NEGATIVE

## 2017-11-26 NOTE — Telephone Encounter (Signed)
Medical Assistant left message on patient's home and cell voicemail. Voicemail states to give a call back to [place] with [facility] at [phone]. Patient is aware of PAP being negative and needing to pick up lovastatin from [facility].

## 2017-11-26 NOTE — Telephone Encounter (Signed)
-----   Message from Clent Demark, PA-C sent at 11/24/2017  8:27 AM EST ----- Pap completely negative.

## 2017-11-30 ENCOUNTER — Telehealth (INDEPENDENT_AMBULATORY_CARE_PROVIDER_SITE_OTHER): Payer: Self-pay

## 2017-11-30 NOTE — Telephone Encounter (Signed)
-----   Message from Clent Demark, PA-C sent at 11/28/2017  2:45 PM EST ----- FIT negative.

## 2017-11-30 NOTE — Telephone Encounter (Signed)
Left patient a message informing of negative FIT and pap. Elevated cholesterol and WBC, statin sent to [facility] and to call the office to schedule elevated WBC work up in one to two weeks. Nat Christen, CMA

## 2017-12-03 ENCOUNTER — Other Ambulatory Visit: Payer: Self-pay

## 2017-12-03 ENCOUNTER — Emergency Department (HOSPITAL_COMMUNITY): Payer: Medicaid Other

## 2017-12-03 ENCOUNTER — Encounter (HOSPITAL_COMMUNITY): Payer: Self-pay | Admitting: *Deleted

## 2017-12-03 ENCOUNTER — Telehealth (INDEPENDENT_AMBULATORY_CARE_PROVIDER_SITE_OTHER): Payer: Self-pay | Admitting: Physician Assistant

## 2017-12-03 ENCOUNTER — Emergency Department (HOSPITAL_COMMUNITY)
Admission: EM | Admit: 2017-12-03 | Discharge: 2017-12-03 | Disposition: A | Discharge status: Home / Self Care | Payer: Medicaid Other | Attending: Emergency Medicine | Admitting: Emergency Medicine

## 2017-12-03 DIAGNOSIS — F17210 Nicotine dependence, cigarettes, uncomplicated: Secondary | ICD-10-CM | POA: Insufficient documentation

## 2017-12-03 DIAGNOSIS — K5792 Diverticulitis of intestine, part unspecified, without perforation or abscess without bleeding: Secondary | ICD-10-CM

## 2017-12-03 HISTORY — DX: Diverticulitis of intestine, part unspecified, without perforation or abscess without bleeding: K57.92

## 2017-12-03 LAB — COMPREHENSIVE METABOLIC PANEL
ALBUMIN: 4.3 g/dL (ref 3.5–5.0)
ALK PHOS: 79 U/L (ref 38–126)
ALT: 17 U/L (ref 14–54)
AST: 21 U/L (ref 15–41)
Anion gap: 12 (ref 5–15)
BILIRUBIN TOTAL: 0.9 mg/dL (ref 0.3–1.2)
BUN: 11 mg/dL (ref 6–20)
CALCIUM: 9.5 mg/dL (ref 8.9–10.3)
CO2: 21 mmol/L — AB (ref 22–32)
CREATININE: 0.85 mg/dL (ref 0.44–1.00)
Chloride: 103 mmol/L (ref 101–111)
GFR calc Af Amer: >60 mL/min (ref >60)
GFR calc non Af Amer: >60 mL/min (ref >60)
GLUCOSE: 109 mg/dL — AB (ref 65–99)
Potassium: 4.4 mmol/L (ref 3.5–5.1)
SODIUM: 136 mmol/L (ref 135–145)
TOTAL PROTEIN: 8.4 g/dL — AB (ref 6.5–8.1)

## 2017-12-03 LAB — URINALYSIS, ROUTINE W REFLEX MICROSCOPIC
Bilirubin Urine: NEGATIVE
GLUCOSE, UA: NEGATIVE mg/dL
Ketones, ur: 5 mg/dL — AB
Leukocytes, UA: NEGATIVE
NITRITE: NEGATIVE
PROTEIN: NEGATIVE mg/dL
pH: 6 (ref 5.0–8.0)

## 2017-12-03 LAB — LIPASE, BLOOD: Lipase: 32 U/L (ref 11–51)

## 2017-12-03 LAB — CBC
HCT: 41.3 % (ref 36.0–46.0)
HEMOGLOBIN: 13.8 g/dL (ref 12.0–15.0)
MCH: 31 pg (ref 26.0–34.0)
MCHC: 33.4 g/dL (ref 30.0–36.0)
MCV: 92.8 fL (ref 78.0–100.0)
Platelets: 397 10*3/uL (ref 150–400)
RBC: 4.45 MIL/uL (ref 3.87–5.11)
RDW: 14.2 % (ref 11.5–15.5)
WBC: 18.3 10*3/uL — ABNORMAL HIGH (ref 4.0–10.5)

## 2017-12-03 LAB — I-STAT BETA HCG BLOOD, ED (MC, WL, AP ONLY)

## 2017-12-03 MED ORDER — TRAMADOL HCL 50 MG PO TABS
50.0000 mg | ORAL_TABLET | Freq: Once | ORAL | Status: AC
  Administered 2017-12-03: 50 mg via ORAL
  Filled 2017-12-03: qty 1

## 2017-12-03 MED ORDER — TRAMADOL HCL 50 MG PO TABS: 50.0000 mg | ORAL_TABLET | Freq: Four times a day (QID) | ORAL | 0 refills | Status: DC | PRN

## 2017-12-03 MED ORDER — ONDANSETRON 4 MG PO TBDP: 4.0000 mg | ORAL_TABLET | Freq: Three times a day (TID) | ORAL | 0 refills | Status: DC | PRN

## 2017-12-03 MED ORDER — IOPAMIDOL (ISOVUE-300) INJECTION 61%
INTRAVENOUS | Status: AC
  Administered 2017-12-03: 100 mL
  Filled 2017-12-03: qty 100

## 2017-12-03 MED ORDER — CIPROFLOXACIN HCL 500 MG PO TABS: 500.0000 mg | ORAL_TABLET | Freq: Two times a day (BID) | ORAL | 0 refills | Status: AC

## 2017-12-03 MED ORDER — ONDANSETRON HCL 4 MG/2ML IJ SOLN
4.0000 mg | Freq: Once | INTRAMUSCULAR | Status: AC
  Administered 2017-12-03: 4 mg via INTRAVENOUS
  Filled 2017-12-03: qty 2

## 2017-12-03 MED ORDER — METRONIDAZOLE 500 MG PO TABS: 500.0000 mg | ORAL_TABLET | Freq: Three times a day (TID) | ORAL | 0 refills | Status: AC

## 2017-12-03 MED ORDER — SODIUM CHLORIDE 0.9 % IV BOLUS (SEPSIS)
1000.0000 mL | Freq: Once | INTRAVENOUS | Status: AC
  Administered 2017-12-03: 1000 mL via INTRAVENOUS

## 2017-12-03 MED ORDER — MORPHINE SULFATE (PF) 4 MG/ML IV SOLN
4.0000 mg | Freq: Once | INTRAVENOUS | Status: DC
  Filled 2017-12-03: qty 1

## 2017-12-03 NOTE — ED Provider Notes (Signed)
[place] EMERGENCY DEPARTMENT Provider Note   CSN: [id number] Arrival date & time: 12/03/17  1320     History   Chief Complaint Chief Complaint  Patient presents with  . Abdominal Pain    HPI Audrey Beck is a 52 y.o. female.  HPI  52 year old female with a history of diverticulitis presents with concern for left lower quadrant abdominal pain.  Patient reports she had prior episodes of diverticulitis, this months feels similar.  Reports that the symptoms began yesterday.  Describes a sharp constant pain in the left lower left upper quadrant, which is improved by certain positions, and worsened by other positions, for example laying on her right side.  Reports she has had nausea, but no vomiting.  Reports she had temperatures of 99 at home but no other fevers.  Reports has had cough for the last week, with sensation of hot and cold, initially thought her symptoms could be secondary to this, but denies any shortness of breath, chest pain.  Past Medical History:  Diagnosis Date  . Diverticulitis   . Polyp of colon     There are no active problems to display for this patient.   Past Surgical History:  Procedure Laterality Date  . ABDOMINAL HYSTERECTOMY     partial  . BACK SURGERY     2001  . FOOT FRACTURE SURGERY      OB History    No data available       Home Medications    Prior to Admission medications   Medication Sig Start Date End Date Taking? Authorizing Provider  acetaminophen (TYLENOL) 500 MG tablet Take 1 tablet (500 mg total) by mouth every 8 (eight) hours as needed. 11/22/17   Clent Demark, PA-C  ciprofloxacin (CIPRO) 500 MG tablet Take 1 tablet (500 mg total) by mouth every 12 (twelve) hours for 10 days. 12/03/17 12/13/17  Gareth Morgan, MD  lovastatin (MEVACOR) 40 MG tablet Take 1 tablet (40 mg total) by mouth at bedtime. 11/23/17   Clent Demark, PA-C  metroNIDAZOLE (FLAGYL) 500 MG tablet Take 1 tablet (500 mg total) by  mouth 3 (three) times daily for 10 days. 12/03/17 12/13/17  Gareth Morgan, MD  ondansetron (ZOFRAN ODT) 4 MG disintegrating tablet Take 1 tablet (4 mg total) by mouth every 8 (eight) hours as needed for nausea or vomiting. 12/03/17   Gareth Morgan, MD  traMADol (ULTRAM) 50 MG tablet Take 1 tablet (50 mg total) by mouth every 6 (six) hours as needed. 12/03/17   Gareth Morgan, MD    Family History No family history on file.  Social History Social History   Tobacco Use  . Smoking status: Current Every Day Smoker    Packs/day: 0.50    Types: Cigarettes  . Smokeless tobacco: Never Used  Substance Use Topics  . Alcohol use: Yes    Comment: socially  . Drug use: Yes    Types: Marijuana    Comment: occ     Allergies   Patient has no known allergies.   Review of Systems Review of Systems  Constitutional: Negative for fever.  HENT: Positive for congestion. Negative for sore throat.   Eyes: Negative for visual disturbance.  Respiratory: Positive for cough. Negative for shortness of breath.   Cardiovascular: Negative for chest pain.  Gastrointestinal: Positive for diarrhea and nausea. Negative for abdominal pain and vomiting.  Genitourinary: Negative for difficulty urinating and dysuria.  Musculoskeletal: Negative for back pain and neck pain.  Skin: Negative  for rash.  Neurological: Negative for syncope and headaches.     Physical Exam Updated Vital Signs BP 113/83   Pulse 90   Temp 98.4 F (36.9 C)   Resp 18   Ht 5\' 6"  (1.676 m)   Wt 77.1 kg (170 lb)   SpO2 98%   BMI 27.44 kg/m   Physical Exam  Constitutional: She is oriented to person, place, and time. She appears well-developed and well-nourished. No distress.  HENT:  Head: Normocephalic and atraumatic.  Eyes: Conjunctivae and EOM are normal.  Neck: Normal range of motion.  Cardiovascular: Normal rate, regular rhythm, normal heart sounds and intact distal pulses. Exam reveals no gallop and no friction rub.  No  murmur heard. Pulmonary/Chest: Effort normal and breath sounds normal. No respiratory distress. She has no wheezes. She has no rales.  Abdominal: Soft. She exhibits no distension. There is tenderness in the left upper quadrant and left lower quadrant. There is guarding and CVA tenderness.  Musculoskeletal: She exhibits no edema or tenderness.  Neurological: She is alert and oriented to person, place, and time.  Skin: Skin is warm and dry. No rash noted. She is not diaphoretic. No erythema.  Nursing note and vitals reviewed.    ED Treatments / Results  Labs (all labs ordered are listed, but only abnormal results are displayed) Labs Reviewed  COMPREHENSIVE METABOLIC PANEL - Abnormal; Notable for the following components:      Result Value   CO2 21 (*)    Glucose, Bld 109 (*)    Total Protein 8.4 (*)    All other components within normal limits  CBC - Abnormal; Notable for the following components:   WBC 18.3 (*)    All other components within normal limits  URINALYSIS, ROUTINE W REFLEX MICROSCOPIC - Abnormal; Notable for the following components:   APPearance HAZY (*)    Specific Gravity, Urine >1.046 (*)    Hgb urine dipstick MODERATE (*)    Ketones, ur 5 (*)    Bacteria, UA FEW (*)    Squamous Epithelial / LPF 6-30 (*)    All other components within normal limits  LIPASE, BLOOD  I-STAT BETA HCG BLOOD, ED ([facility], [facility], [facility] ONLY)    EKG  EKG Interpretation None       Radiology Ct Abdomen Pelvis W Contrast  Result Date: 12/03/2017 CLINICAL DATA:  52 year old female with acute abdominal pain for 1 day. EXAM: CT ABDOMEN AND PELVIS WITH CONTRAST TECHNIQUE: Multidetector CT imaging of the abdomen and pelvis was performed using the standard protocol following bolus administration of intravenous contrast. CONTRAST:  100 cc intravenous Isovue-300 COMPARISON:  02/18/2016 CT FINDINGS: Lower chest: No acute abnormality Hepatobiliary: The liver and gallbladder are unremarkable. No biliary  dilatation. Pancreas: Unremarkable Spleen: Unremarkable Adrenals/Urinary Tract: The kidneys, adrenal glands and bladder are unremarkable. Stomach/Bowel: There is focal wall thickening of the distal descending colon with adjacent inflammation compatible with acute diverticulitis. No evidence of bowel obstruction, pneumoperitoneum or abscess. No bowel obstruction identified. No other areas of bowel wall thickening or inflammation noted. The appendix is normal. Vascular/Lymphatic: Aortic atherosclerosis. No enlarged abdominal or pelvic lymph nodes. Reproductive: Status post hysterectomy. No adnexal masses. Other: No ascites or abdominal wall hernia. Musculoskeletal: No acute or suspicious bony abnormalities. Degenerative changes within the thoracic and lumbar spine again noted. IMPRESSION: 1. Focal wall thickening with adjacent inflammation of the distal descending being: Compatible with acute diverticulitis. No evidence of bowel obstruction, pneumoperitoneum or abscess. Correlation with the patient's colon  cancer screening history is recommended. If screening is not up-to-date, appropriate screening should be considered when clinically able. 2.  Aortic Atherosclerosis (ICD10-I70.0). Electronically Signed   By: Margarette Canada M.D.   On: 12/03/2017 17:48    Procedures Procedures (including critical care time)  Medications Ordered in ED Medications  sodium chloride 0.9 % bolus 1,000 mL (0 mLs Intravenous Stopped 12/03/17 2000)  ondansetron (ZOFRAN) injection 4 mg (4 mg Intravenous Given 12/03/17 1704)  iopamidol (ISOVUE-300) 61 % injection (100 mLs  Contrast Given 12/03/17 1725)  traMADol (ULTRAM) tablet 50 mg (50 mg Oral Given 12/03/17 1736)     Initial Impression / Assessment and Plan / ED Course  I have reviewed the triage vital signs and the nursing notes.  Pertinent labs & imaging results that were available during my care of the patient were reviewed by me and considered in my medical decision making (see  chart for details).     52 year old female with a history of diverticulitis presents with concern for left lower quadrant abdominal pain.  CT confirms uncomplicated diverticulitis without signs of abscess or perforation.  Patient is well-appearing, hemodynamically stable, appropriate for outpatient treatment of her diverticulitis.  She was given a prescription for Flagyl and Cipro, as well as a prescription for Zofran and tramadol.  Case management worked with patient in order to help her obtain medications.  Discussed with patient that given her multiple episodes of diverticulitis, at this point is reasonable for her to follow-up with general surgery, as well as her primary care physician.  Discussed return precautions in detail.  Patient discharged in stable condition with understanding of reasons to return.   Final Clinical Impressions(s) / ED Diagnoses   Final diagnoses:  Diverticulitis    ED Discharge Orders        Ordered    metroNIDAZOLE (FLAGYL) 500 MG tablet  3 times daily     12/03/17 1930    ciprofloxacin (CIPRO) 500 MG tablet  Every 12 hours     12/03/17 1930    ondansetron (ZOFRAN ODT) 4 MG disintegrating tablet  Every 8 hours PRN     12/03/17 1930    traMADol (ULTRAM) 50 MG tablet  Every 6 hours PRN     12/03/17 1930       Gareth Morgan, MD 12/04/17 [id number]

## 2017-12-03 NOTE — Telephone Encounter (Signed)
Patient LVM saying that her RX  lovastatin (MEVACOR) 40 MG tablet  Is not cover by Medicaid and also she said that in the past PA Altamease Oiler prescribe something for her Dermatitis and she would like to have it  . Please, call her at  [phone] Thank you

## 2017-12-03 NOTE — Telephone Encounter (Signed)
Lovastatin was sent to [facility]. This is on the $4 list. The cost would be $10 for three months. I don't see where I prescribed something for her dermatitis. Needs evaluation.

## 2017-12-03 NOTE — Telephone Encounter (Signed)
FWD to PCP. Tempestt S Roberts, CMA  

## 2017-12-03 NOTE — ED Triage Notes (Signed)
Pt in c/o L sided abd pain with hx of diverticulitis, pt c/o pain onset yesterday, pt reports 10-12 stools in the last 24 hrs, pt c/o nausea, denies vomiting, pt A&O x4, denies fever & chills

## 2017-12-03 NOTE — Care Management (Signed)
ED CM consulted by Dr Billy Fischer concerning medication assistance.  Patient has had 2 ED visits in the past 6 months.  Met with patient at bedside she reports not being able to afford medications. Patient is eligible for [facility] program. Discussed MATCH guidelines including $3 copay per prescription and once per calendar year, patient is agreeable with plan.  [facility] letter printed with instructions on how to redeem. Updated Dr. Billy Fischer on transitional care plan. Patient encourage to keep follow up appointment with Dr. Altamease Oiler 12/09/17. No further ED CM needs

## 2017-12-06 NOTE — Telephone Encounter (Signed)
Left patient a message that lovastatin was sent to [facility] it is on the $4 list and for three months it would be $10, no medication on file for dermatitis, will need to schedule appointment to have evaluated. Nat Christen, CMA

## 2017-12-09 ENCOUNTER — Ambulatory Visit (INDEPENDENT_AMBULATORY_CARE_PROVIDER_SITE_OTHER): Payer: Medicaid Other | Admitting: Physician Assistant

## 2018-01-04 ENCOUNTER — Ambulatory Visit (INDEPENDENT_AMBULATORY_CARE_PROVIDER_SITE_OTHER): Payer: Self-pay | Admitting: Physician Assistant

## 2018-01-04 ENCOUNTER — Encounter (INDEPENDENT_AMBULATORY_CARE_PROVIDER_SITE_OTHER): Payer: Self-pay | Admitting: Physician Assistant

## 2018-01-04 ENCOUNTER — Other Ambulatory Visit: Payer: Self-pay

## 2018-01-04 VITALS — BP 116/84 | HR 72 | Temp 97.7°F | Ht 66.0 in | Wt 166.2 lb

## 2018-01-04 DIAGNOSIS — M25542 Pain in joints of left hand: Secondary | ICD-10-CM

## 2018-01-04 DIAGNOSIS — G8929 Other chronic pain: Secondary | ICD-10-CM

## 2018-01-04 DIAGNOSIS — M5441 Lumbago with sciatica, right side: Secondary | ICD-10-CM

## 2018-01-04 DIAGNOSIS — K5732 Diverticulitis of large intestine without perforation or abscess without bleeding: Secondary | ICD-10-CM

## 2018-01-04 MED ORDER — METRONIDAZOLE 500 MG PO TABS: 500.0000 mg | ORAL_TABLET | Freq: Three times a day (TID) | ORAL | 1 refills | Status: DC

## 2018-01-04 MED ORDER — NAPROXEN 500 MG PO TABS: 500.0000 mg | ORAL_TABLET | Freq: Two times a day (BID) | ORAL | 0 refills | Status: DC

## 2018-01-04 MED ORDER — CIPROFLOXACIN HCL 500 MG PO TABS: 500.0000 mg | ORAL_TABLET | Freq: Two times a day (BID) | ORAL | 1 refills | Status: DC

## 2018-01-04 MED ORDER — SULFAMETHOXAZOLE-TRIMETHOPRIM 800-160 MG PO TABS
1.0000 | ORAL_TABLET | Freq: Two times a day (BID) | ORAL | 0 refills | Status: DC
Start: 2018-01-04 — End: 2018-04-28

## 2018-01-04 MED ORDER — CYCLOBENZAPRINE HCL 5 MG PO TABS: 5.0000 mg | ORAL_TABLET | Freq: Every day | ORAL | 2 refills | Status: DC

## 2018-01-04 NOTE — Progress Notes (Signed)
Subjective:  Patient ID: Audrey Beck, female    DOB: September 17, 1966  Age: 52 y.o. MRN: [id number]  CC: hand pain and leukocytosis  HPI Audrey Beck is a RHD 52 y.o. female with a medical history of diverticulitis, right sided sciatica, and colon polyp presents on f/u of hospital visit for diverticulitis. Was found to have leukocytosis at her office visit and asked to return for redraw/evaluation. Pt later developed LLQ pain and went to ED which revealed diverticulitis. Prescribed Cipro and Flagyl which have resolved the LLQ pain. Pt requests a refills for Cipro and Flagyl so that she would not have to see a provider should she have diverticular pain in the future.     Also has generalized left hand pain partly attributed to work. Greatest pain felt at the interphalangeal joint of the left thumb. Reports having accidentally penetrated a needle at the IP joint approximately 5-6 weeks ago. The IP joint is now mildly edematous and notices difficulty with fully extending thumb. Feels the thumb "locks" in place. Does not endorse any other complaint or symptom.      Would like to be prescribed a muscle relaxant for her chronic LBP with sciatica. Has run out of tramadol and is lacking in pain relief.       Outpatient Medications Prior to Visit  Medication Sig Dispense Refill  . lovastatin (MEVACOR) 40 MG tablet Take 1 tablet (40 mg total) by mouth at bedtime. 90 tablet 3  . traMADol (ULTRAM) 50 MG tablet Take 1 tablet (50 mg total) by mouth every 6 (six) hours as needed. (Patient not taking: Reported on 01/04/2018) 15 tablet 0  . acetaminophen (TYLENOL) 500 MG tablet Take 1 tablet (500 mg total) by mouth every 8 (eight) hours as needed. 60 tablet 0  . ondansetron (ZOFRAN ODT) 4 MG disintegrating tablet Take 1 tablet (4 mg total) by mouth every 8 (eight) hours as needed for nausea or vomiting. 16 tablet 0   No facility-administered medications prior to visit.      ROS Review of Systems   Constitutional: Negative for chills, fever and malaise/fatigue.  Eyes: Negative for blurred vision.  Respiratory: Negative for shortness of breath.   Cardiovascular: Negative for chest pain and palpitations.  Gastrointestinal: Negative for abdominal pain, nausea and vomiting.  Genitourinary: Negative for dysuria and hematuria.  Musculoskeletal: Positive for joint pain. Negative for myalgias.  Skin: Negative for rash.  Neurological: Negative for tingling and headaches.  Psychiatric/Behavioral: Negative for depression. The patient is not nervous/anxious.     Objective:  BP 116/84 (BP Location: Left Arm, Patient Position: Sitting, Cuff Size: Normal)   Pulse 72   Temp 97.7 F (36.5 C) (Oral)   Ht 5\' 6"  (1.676 m)   Wt 166 lb 3.2 oz (75.4 kg)   SpO2 96%   BMI 26.83 kg/m   BP/Weight 01/04/2018 12/03/2017 9/44/9675  Systolic BP 916 384 665  Diastolic BP 84 83 69  Wt. (Lbs) 166.2 170 170  BMI 26.83 27.44 27.44      Physical Exam  Constitutional: She is oriented to person, place, and time.  HENT:  Head: Normocephalic and atraumatic.  Eyes: Conjunctivae are normal. No scleral icterus.  Neck: No thyromegaly present.  Cardiovascular: Normal rate, regular rhythm and normal heart sounds.  Pulmonary/Chest: Effort normal and breath sounds normal.  Abdominal: Soft. She exhibits no distension. There is no tenderness. There is no guarding.  Musculoskeletal: She exhibits no edema.  Left thumb IP joint with mild edema and with  difficulty in full extending thumb. No cellulitis. No generalized edema of the left hand.   Lymphadenopathy:    She has no cervical adenopathy.  Neurological: She is alert and oriented to person, place, and time.  Skin: Skin is warm and dry. No rash noted.     Assessment & Plan:    1. Diverticulitis of colon - Refill ciprofloxacin (CIPRO) 500 MG tablet; Take 1 tablet (500 mg total) by mouth 2 (two) times daily.  Dispense: 20 tablet; Refill: 1 - Refill  metroNIDAZOLE (FLAGYL) 500 MG tablet; Take 1 tablet (500 mg total) by mouth 3 (three) times daily.  Dispense: 30 tablet; Refill: 1  2. Pain in thumb joint with movement, left - Begin sulfamethoxazole-trimethoprim (BACTRIM DS,SEPTRA DS) 800-160 MG tablet; Take 1 tablet by mouth 2 (two) times daily.  Dispense: 20 tablet; Refill: 0 - Begin naproxen (NAPROSYN) 500 MG tablet; Take 1 tablet (500 mg total) by mouth 2 (two) times daily with a meal.  Dispense: 20 tablet; Refill: 0 - Rheumatoid factor  3. Chronic right-sided low back pain with right-sided sciatica - Ambulatory referral to Pain Clinic - Begin cyclobenzaprine 5 mg, one pill qhs, x30 days, #30    Meds ordered this encounter  Medications  . sulfamethoxazole-trimethoprim (BACTRIM DS,SEPTRA DS) 800-160 MG tablet    Sig: Take 1 tablet by mouth 2 (two) times daily.    Dispense:  20 tablet    Refill:  0    Order Specific Question:   Supervising Provider    Answer:   Tresa Garter [id number]  . naproxen (NAPROSYN) 500 MG tablet    Sig: Take 1 tablet (500 mg total) by mouth 2 (two) times daily with a meal.    Dispense:  20 tablet    Refill:  0    Order Specific Question:   Supervising Provider    Answer:   Tresa Garter [id number]  . ciprofloxacin (CIPRO) 500 MG tablet    Sig: Take 1 tablet (500 mg total) by mouth 2 (two) times daily.    Dispense:  20 tablet    Refill:  1    Order Specific Question:   Supervising Provider    Answer:   Tresa Garter [id number]  . metroNIDAZOLE (FLAGYL) 500 MG tablet    Sig: Take 1 tablet (500 mg total) by mouth 3 (three) times daily.    Dispense:  30 tablet    Refill:  1    Order Specific Question:   Supervising Provider    Answer:   Tresa Garter [id number]    Follow-up: Return in about 1 month (around 02/01/2018), or if symptoms worsen or fail to improve.   Clent Demark PA

## 2018-01-04 NOTE — Patient Instructions (Signed)

## 2018-01-05 LAB — RHEUMATOID FACTOR: Rhuematoid fact SerPl-aCnc: 10.3 IU/mL (ref 0.0–13.9)

## 2018-01-05 MED FILL — CYCLOBENZAPRINE 5 MG TABLET: 5 | 30 days supply | Qty: 30 | Fill #0

## 2018-01-05 MED FILL — NAPROXEN 500 MG TABLET: 500 | 10 days supply | Qty: 20 | Fill #0

## 2018-01-05 MED FILL — SULFAMETHOXAZOLE-TMP DS TAB: 800-160 | 10 days supply | Qty: 20 | Fill #0

## 2018-01-05 MED FILL — CIPROFLOXACIN HCL 500 MG TA: 500 | 10 days supply | Qty: 20 | Fill #0

## 2018-01-05 MED FILL — metroNIDAZOLE 500 MG TABS: 500 | 10 days supply | Qty: 30 | Fill #0

## 2018-01-31 ENCOUNTER — Ambulatory Visit: Payer: Medicaid Other | Attending: Physician Assistant

## 2018-02-01 ENCOUNTER — Ambulatory Visit: Payer: Medicaid Other | Admitting: Student in an Organized Health Care Education/Training Program

## 2018-03-28 ENCOUNTER — Telehealth (INDEPENDENT_AMBULATORY_CARE_PROVIDER_SITE_OTHER): Payer: Self-pay | Admitting: Physician Assistant

## 2018-03-28 NOTE — Telephone Encounter (Signed)
There are no ophthalmologists or dentists that will take the [facility] discount. If she has the orange card, then she may call [phone] for dental care. Ophthalmology care is not covered by [facility] discount and she will need to pay out of pocket at her choice of ophthalmology office. Pt may be mailed an authorization for medical information release. She will need to also provide [place] the fax number to her previous provider's office.

## 2018-03-28 NOTE — Telephone Encounter (Signed)
Patient stating that she now has the [facility] discount and would like to get a referral for a dental clinic and also to get her eye exam. Patient states her tooth is broken in half and will need it urgent. Patient would like to know if we can mail her a medical release form in order to get her medical records from [facility].   Please Advise [phone]  Thank you

## 2018-04-21 ENCOUNTER — Telehealth (INDEPENDENT_AMBULATORY_CARE_PROVIDER_SITE_OTHER): Payer: Self-pay | Admitting: Physician Assistant

## 2018-04-21 NOTE — Telephone Encounter (Signed)
Open in error

## 2018-04-25 ENCOUNTER — Telehealth (INDEPENDENT_AMBULATORY_CARE_PROVIDER_SITE_OTHER): Payer: Self-pay | Admitting: Physician Assistant

## 2018-04-25 NOTE — Telephone Encounter (Signed)
Patient call requesting medication refill for diverticulitis due to flaring up on her right side. Patient stated that PCP had given her antibiotics last time.   Patient uses [place], [address]  Please Advice [phone]  Thank You Emmit Pomfret

## 2018-04-25 NOTE — Telephone Encounter (Signed)
Does patient need appointment?

## 2018-04-26 NOTE — Telephone Encounter (Signed)
Needs appointment for evaluation or go to urgent care.

## 2018-04-27 NOTE — Telephone Encounter (Signed)
Patient returned call and is scheduled for 8/1 at 9:10. Nat Christen, CMA

## 2018-04-27 NOTE — Telephone Encounter (Signed)
Left message asking patient to return call to office. Kayln Garceau Latrelle Dodrill

## 2018-04-28 ENCOUNTER — Ambulatory Visit (INDEPENDENT_AMBULATORY_CARE_PROVIDER_SITE_OTHER): Payer: Self-pay | Admitting: Physician Assistant

## 2018-04-28 ENCOUNTER — Other Ambulatory Visit: Payer: Self-pay

## 2018-04-28 ENCOUNTER — Encounter (INDEPENDENT_AMBULATORY_CARE_PROVIDER_SITE_OTHER): Payer: Self-pay | Admitting: Physician Assistant

## 2018-04-28 VITALS — BP 111/83 | HR 76 | Temp 98.1°F | Ht 66.0 in | Wt 167.4 lb

## 2018-04-28 DIAGNOSIS — Z1231 Encounter for screening mammogram for malignant neoplasm of breast: Secondary | ICD-10-CM

## 2018-04-28 DIAGNOSIS — M25542 Pain in joints of left hand: Secondary | ICD-10-CM

## 2018-04-28 DIAGNOSIS — Z9189 Other specified personal risk factors, not elsewhere classified: Secondary | ICD-10-CM

## 2018-04-28 DIAGNOSIS — Z1239 Encounter for other screening for malignant neoplasm of breast: Secondary | ICD-10-CM

## 2018-04-28 DIAGNOSIS — Z760 Encounter for issue of repeat prescription: Secondary | ICD-10-CM

## 2018-04-28 DIAGNOSIS — R1032 Left lower quadrant pain: Secondary | ICD-10-CM

## 2018-04-28 DIAGNOSIS — M6740 Ganglion, unspecified site: Secondary | ICD-10-CM

## 2018-04-28 MED ORDER — LOVASTATIN 40 MG PO TABS: 40.0000 mg | ORAL_TABLET | Freq: Every day | ORAL | 11 refills | Status: AC

## 2018-04-28 MED ORDER — METRONIDAZOLE 500 MG PO TABS: 500.0000 mg | ORAL_TABLET | Freq: Three times a day (TID) | ORAL | 1 refills | Status: DC

## 2018-04-28 MED ORDER — CIPROFLOXACIN HCL 500 MG PO TABS: 500.0000 mg | ORAL_TABLET | Freq: Two times a day (BID) | ORAL | 1 refills | Status: DC

## 2018-04-28 MED ORDER — ACETAMINOPHEN 500 MG PO TABS: 1000.0000 mg | ORAL_TABLET | Freq: Three times a day (TID) | ORAL | 1 refills | Status: AC | PRN

## 2018-04-28 MED FILL — CIPROFLOXACIN HCL 500 MG TA: 500 | 10 days supply | Qty: 20 | Fill #0

## 2018-04-28 MED FILL — LOVASTATIN 40 MG TABS: 40 | 30 days supply | Qty: 30 | Fill #0

## 2018-04-28 MED FILL — metroNIDAZOLE 500 MG TABS: 500 | 10 days supply | Qty: 30 | Fill #0

## 2018-04-28 NOTE — Patient Instructions (Signed)

## 2018-04-28 NOTE — Progress Notes (Addendum)
Subjective:  Patient ID: Audrey Beck, female    DOB: [birth year]  Age: 52 y.o. MRN: [id number]  CC: diverticulitis  HPI  Audrey Beck a [facility]83 y.o.femalewith a medical history of diverticulitis, right sided sciatica, and colon polyp presents with complaint of chronic LLQ pain attributed to diverticulitis. Recent episode of LLQ pain since one and a half weeks ago. Most episodes in a year has been 4 in 2018. She is now on her 5th suspected episode in a total of 8 months this year. Endorses mild constipation. Does not endorse fever, chills, nausea, vomiting, BRBPR, or melena. She has completed [employer] process and is 100% covered. Would like referral for surgery. Would also like referral for ganglion cyst removal.      Outpatient Medications Prior to Visit  Medication Sig Dispense Refill  . lovastatin (MEVACOR) 40 MG tablet Take 1 tablet (40 mg total) by mouth at bedtime. 90 tablet 3  . cyclobenzaprine (FLEXERIL) 5 MG tablet Take 1 tablet (5 mg total) by mouth at bedtime. (Patient not taking: Reported on 04/28/2018) 30 tablet 2  . traMADol (ULTRAM) 50 MG tablet Take 1 tablet (50 mg total) by mouth every 6 (six) hours as needed. (Patient not taking: Reported on 01/04/2018) 15 tablet 0  . ciprofloxacin (CIPRO) 500 MG tablet Take 1 tablet (500 mg total) by mouth 2 (two) times daily. 20 tablet 1  . metroNIDAZOLE (FLAGYL) 500 MG tablet Take 1 tablet (500 mg total) by mouth 3 (three) times daily. 30 tablet 1  . naproxen (NAPROSYN) 500 MG tablet Take 1 tablet (500 mg total) by mouth 2 (two) times daily with a meal. 20 tablet 0  . sulfamethoxazole-trimethoprim (BACTRIM DS,SEPTRA DS) 800-160 MG tablet Take 1 tablet by mouth 2 (two) times daily. 20 tablet 0   No facility-administered medications prior to visit.      ROS Review of Systems  Constitutional: Negative for chills, fever and malaise/fatigue.  Eyes: Negative for blurred vision.  Respiratory: Negative for shortness of breath.    Cardiovascular: Negative for chest pain and palpitations.  Gastrointestinal: Positive for abdominal pain and constipation. Negative for blood in stool, diarrhea, melena, nausea and vomiting.  Genitourinary: Negative for dysuria and hematuria.  Musculoskeletal: Positive for joint pain. Negative for myalgias.  Skin: Negative for rash.  Neurological: Negative for tingling and headaches.  Psychiatric/Behavioral: Negative for depression. The patient is not nervous/anxious.     Objective:  Ht 5\' 6"  (1.676 m)   Wt 167 lb 6.4 oz (75.9 kg)   BMI 27.02 kg/m   Vitals:   04/28/18 0835  BP: 111/83  Pulse: 76  Temp: 98.1 F (36.7 C)  TempSrc: Oral  SpO2: 97%  Weight: 167 lb 6.4 oz (75.9 kg)  Height: 5\' 6"  (1.676 m)      Physical Exam  Constitutional: She is oriented to person, place, and time.  Well developed, well nourished, NAD, polite  HENT:  Head: Normocephalic and atraumatic.  Eyes: No scleral icterus.  Neck: Normal range of motion. Neck supple. No thyromegaly present.  Cardiovascular: Normal rate, regular rhythm and normal heart sounds.  Pulmonary/Chest: Effort normal and breath sounds normal.  Abdominal: Soft. Bowel sounds are normal. She exhibits no distension and no mass. There is tenderness (LLQ). There is no rebound and no guarding.  Musculoskeletal: She exhibits no edema.  Nodule on dorsal aspect of left wrist. Left thumb with bony hypertrophy, no erythema, no edema.  Neurological: She is alert and oriented to person, place, and time.  Skin:  Skin is warm and dry. No rash noted. No erythema. No pallor.  Psychiatric: She has a normal mood and affect. Her behavior is normal. Thought content normal.  Vitals reviewed.    Assessment & Plan:   1. Abdominal pain, left lower quadrant - Begin ciprofloxacin (CIPRO) 500 MG tablet; Take 1 tablet (500 mg total) by mouth 2 (two) times daily.  Dispense: 20 tablet; Refill: 1 - Begin metroNIDAZOLE (FLAGYL) 500 MG tablet; Take 1  tablet (500 mg total) by mouth 3 (three) times daily.  Dispense: 30 tablet; Refill: 1 - Begin acetaminophen (TYLENOL) 500 MG tablet; Take 2 tablets (1,000 mg total) by mouth every 8 (eight) hours as needed for up to 14 days.  Dispense: 42 tablet; Refill: 1 - Ambulatory referral to Gastroenterology  2. Medication refill - Refill lovastatin (MEVACOR) 40 MG tablet; Take 1 tablet (40 mg total) by mouth at bedtime.  Dispense: 30 tablet; Refill: 11  3. Pain in thumb joint with movement, left - AMB referral to orthopedics  4. Ganglion cyst - AMB referral to orthopedics  5. Screening for breast cancer - Sent message to [name] to enroll pt into [employer] for free screening.  6. Need for dental care - Referral made to [facility] ordered this encounter  Medications  . lovastatin (MEVACOR) 40 MG tablet    Sig: Take 1 tablet (40 mg total) by mouth at bedtime.    Dispense:  30 tablet    Refill:  11    Order Specific Question:   Supervising Provider    Answer:   Charlott Rakes [[id number]]  . ciprofloxacin (CIPRO) 500 MG tablet    Sig: Take 1 tablet (500 mg total) by mouth 2 (two) times daily.    Dispense:  20 tablet    Refill:  1    Order Specific Question:   Supervising Provider    Answer:   Charlott Rakes [[id number]]  . metroNIDAZOLE (FLAGYL) 500 MG tablet    Sig: Take 1 tablet (500 mg total) by mouth 3 (three) times daily.    Dispense:  30 tablet    Refill:  1    Order Specific Question:   Supervising Provider    Answer:   Charlott Rakes [[id number]]  . acetaminophen (TYLENOL) 500 MG tablet    Sig: Take 2 tablets (1,000 mg total) by mouth every 8 (eight) hours as needed for up to 14 days.    Dispense:  42 tablet    Refill:  1    Order Specific Question:   Supervising Provider    Answer:   Charlott Rakes [[id number]]    Follow-up: Return if symptoms worsen or fail to improve.   Clent Demark PA

## 2018-04-29 ENCOUNTER — Encounter: Payer: Self-pay | Admitting: Physician Assistant

## 2018-04-29 ENCOUNTER — Telehealth (HOSPITAL_COMMUNITY): Payer: Self-pay

## 2018-04-29 NOTE — Telephone Encounter (Signed)
Called patients home number left a message to return call to [facility]

## 2018-05-02 ENCOUNTER — Ambulatory Visit (INDEPENDENT_AMBULATORY_CARE_PROVIDER_SITE_OTHER): Payer: Self-pay

## 2018-05-02 ENCOUNTER — Encounter (INDEPENDENT_AMBULATORY_CARE_PROVIDER_SITE_OTHER): Payer: Self-pay | Admitting: Physician Assistant

## 2018-05-02 ENCOUNTER — Ambulatory Visit (INDEPENDENT_AMBULATORY_CARE_PROVIDER_SITE_OTHER): Payer: Self-pay | Admitting: Physician Assistant

## 2018-05-02 DIAGNOSIS — M79645 Pain in left finger(s): Secondary | ICD-10-CM | POA: Insufficient documentation

## 2018-05-02 DIAGNOSIS — M25532 Pain in left wrist: Secondary | ICD-10-CM

## 2018-05-02 NOTE — Progress Notes (Signed)
Office Visit Note   Patient: Audrey Beck           Date of Birth: Jan 12, 1966           MRN: [id number] Visit Date: 05/02/2018              Requested by: Clent Demark, PA-C [place], [place] [address] PCP: Clent Demark, PA-C   Assessment & Plan: Visit Diagnoses:  1. Pain in left wrist   2. 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  At this point, the patient would like to proceed with excision of the ganglion cyst.  We have discussed this in detail.  Risks, benefits and possible occasions reviewed.  Rehab and recovery time discussed.  All questions were answered.  We will be in touch with the patient to schedule the surgery.  Follow-Up Instructions: Return for 2 weeks post-op.   Orders:  Orders Placed This Encounter  Procedures  . XR Wrist Complete Left  . XR Finger Thumb Left   No orders of the defined types were placed in this encounter.     Procedures: No procedures performed   Clinical Data: No additional findings.   Subjective: Chief Complaint  Patient presents with  . Left Wrist - Pain  . Left Thumb - Pain    HPI patient is a pleasant 52 year old right-hand-dominant female who presents to our clinic today with left wrist and thumb pain.  In regards to the left wrist, she complains of a recurrent Baker's cyst to the dorsal aspect.  This started about 5 or 6 months ago without any known injury.  This has gotten bigger in size but remains nontender.  She has not noticed any drainage or fevers or chills.  The pain she has is with flexion extension of the wrist.  She does have a history of a ganglion cyst to this exact area which was hit with a thick book in a doctor's office.  This seemed to help but the cyst returned.  No numbness, tingling or burning.  Other issue she has is left thumb pain to the DIP joint.  Worse with motion.  Pain began here after stabbing herself with a  tagging gun at work.  The pain has somewhat improved but still remains with motion of the thumb.  Review of Systems as detailed in HPI.  All others reviewed and are negative.   Objective: Vital Signs: There were no vitals taken for this visit.  Physical Exam well-developed and well-nourished female no acute distress.  Alert and oriented x3.  Ortho Exam examination of the left wrist reveals a pea-sized ganglion to the dorsum of the wrist.  This is nontender and nonerythematous.  She does have mild pain to the IP joint of the thumb.  Pain is worse with range of motion.  She is neurovascularly intact distally.  Specialty Comments:  No specialty comments available.  Imaging: Xr Finger Thumb Left  Result Date: 05/02/2018 X-rays of the left thumb show significant degenerative changes to the IP joint  Xr Wrist Complete Left  Result Date: 05/02/2018 X-rays of the left wrist show no acute findings.  She does have what appears to be an old avulsion fracture off the radial styloid    PMFS History: Patient Active Problem List   Diagnosis Date Noted  . Pain in left wrist 05/02/2018  . Thumb pain, left 05/02/2018   Past Medical History:  Diagnosis Date  . Diverticulitis   . Polyp of colon     History reviewed. No pertinent family history.  Past Surgical History:  Procedure Laterality Date  . ABDOMINAL HYSTERECTOMY     partial  . BACK SURGERY     2001  . FOOT FRACTURE SURGERY     Social History   Occupational History  . Not on file  Tobacco Use  . Smoking status: Former Smoker    Packs/day: 0.50    Types: Cigarettes  . Smokeless tobacco: Never Used  . Tobacco comment: pt has not had a cigarette in 9 days  Substance and Sexual Activity  . Alcohol use: Yes    Comment: socially  . Drug use: Yes    Types: Marijuana    Comment: occ  . Sexual activity: Not Currently

## 2018-05-03 ENCOUNTER — Other Ambulatory Visit: Payer: Self-pay | Admitting: Obstetrics and Gynecology

## 2018-05-03 DIAGNOSIS — Z1231 Encounter for screening mammogram for malignant neoplasm of breast: Secondary | ICD-10-CM

## 2018-05-11 ENCOUNTER — Telehealth (INDEPENDENT_AMBULATORY_CARE_PROVIDER_SITE_OTHER): Payer: Self-pay

## 2018-05-11 NOTE — Telephone Encounter (Signed)
I called [phone] to speak with patient about scheduling surgery.  [employer] message states that number has calling restrictions that prevent the completion of the call.

## 2018-05-16 NOTE — Progress Notes (Signed)
Spoke with Alica at Dr. Erlinda Hong office with request to have orders placed for surgical procedure this Friday.

## 2018-05-17 ENCOUNTER — Encounter (HOSPITAL_BASED_OUTPATIENT_CLINIC_OR_DEPARTMENT_OTHER): Payer: Self-pay | Admitting: *Deleted

## 2018-05-17 ENCOUNTER — Other Ambulatory Visit: Payer: Self-pay

## 2018-05-19 ENCOUNTER — Encounter (HOSPITAL_BASED_OUTPATIENT_CLINIC_OR_DEPARTMENT_OTHER): Payer: Self-pay | Admitting: *Deleted

## 2018-05-19 NOTE — Anesthesia Preprocedure Evaluation (Addendum)
Anesthesia Evaluation  Patient identified by MRN, date of birth, ID band Patient awake    Reviewed: Allergy & Precautions, NPO status , Patient's Chart, lab work & pertinent test results  Airway Mallampati: II  TM Distance: >3 FB Neck ROM: Full    Dental no notable dental hx. (+) Teeth Intact, Dental Advisory Given   Pulmonary Current Smoker,    Pulmonary exam normal breath sounds clear to auscultation       Cardiovascular Exercise Tolerance: Good negative cardio ROS Normal cardiovascular exam Rhythm:Regular Rate:Normal     Neuro/Psych negative neurological ROS  negative psych ROS   GI/Hepatic negative GI ROS,   Endo/Other    Renal/GU      Musculoskeletal   Abdominal   Peds  Hematology negative hematology ROS (+)   Anesthesia Other Findings   Reproductive/Obstetrics                             Anesthesia Physical Anesthesia Plan  ASA: II  Anesthesia Plan: Bier Block and MAC and Bier Block-LIDOCAINE ONLY   Post-op Pain Management:    Induction:   PONV Risk Score and Plan: Treatment may vary due to age or medical condition  Airway Management Planned: Natural Airway and Nasal Cannula  Additional Equipment:   Intra-op Plan:   Post-operative Plan:   Informed Consent: I have reviewed the patients History and Physical, chart, labs and discussed the procedure including the risks, benefits and alternatives for the proposed anesthesia with the patient or authorized representative who has indicated his/her understanding and acceptance.   Dental advisory given  Plan Discussed with:   Anesthesia Plan Comments:         Anesthesia Quick Evaluation

## 2018-05-20 ENCOUNTER — Other Ambulatory Visit: Payer: Self-pay

## 2018-05-20 ENCOUNTER — Encounter (HOSPITAL_BASED_OUTPATIENT_CLINIC_OR_DEPARTMENT_OTHER): Payer: Self-pay

## 2018-05-20 ENCOUNTER — Encounter (HOSPITAL_BASED_OUTPATIENT_CLINIC_OR_DEPARTMENT_OTHER)
Admission: RE | Disposition: A | Discharge status: Home / Self Care | Payer: Self-pay | Source: Ambulatory Visit | Attending: Orthopaedic Surgery

## 2018-05-20 ENCOUNTER — Ambulatory Visit (HOSPITAL_BASED_OUTPATIENT_CLINIC_OR_DEPARTMENT_OTHER): Payer: Self-pay | Admitting: Anesthesiology

## 2018-05-20 ENCOUNTER — Ambulatory Visit (HOSPITAL_BASED_OUTPATIENT_CLINIC_OR_DEPARTMENT_OTHER)
Admission: RE | Admit: 2018-05-20 | Discharge: 2018-05-20 | Disposition: A | Discharge status: Home / Self Care | Payer: Self-pay | Source: Ambulatory Visit | Attending: Orthopaedic Surgery | Admitting: Orthopaedic Surgery

## 2018-05-20 DIAGNOSIS — F17210 Nicotine dependence, cigarettes, uncomplicated: Secondary | ICD-10-CM | POA: Insufficient documentation

## 2018-05-20 DIAGNOSIS — M659 Synovitis and tenosynovitis, unspecified: Secondary | ICD-10-CM

## 2018-05-20 DIAGNOSIS — M67432 Ganglion, left wrist: Secondary | ICD-10-CM

## 2018-05-20 DIAGNOSIS — M65932 Unspecified synovitis and tenosynovitis, left forearm: Secondary | ICD-10-CM

## 2018-05-20 HISTORY — PX: GANGLION CYST EXCISION: SHX1691

## 2018-05-20 SURGERY — EXCISION, GANGLION CYST, WRIST
Anesthesia: Monitor Anesthesia Care | Site: Wrist | Laterality: Left

## 2018-05-20 MED ORDER — HYDROMORPHONE HCL 1 MG/ML IJ SOLN: 0.2500 mg | INTRAMUSCULAR | Status: DC | PRN

## 2018-05-20 MED ORDER — ONDANSETRON HCL 4 MG/2ML IJ SOLN
INTRAMUSCULAR | Status: DC | PRN
  Administered 2018-05-20: 4 mg via INTRAVENOUS

## 2018-05-20 MED ORDER — CEFAZOLIN SODIUM-DEXTROSE 2-4 GM/100ML-% IV SOLN
2.0000 g | INTRAVENOUS | Status: AC
  Administered 2018-05-20: 2 g via INTRAVENOUS

## 2018-05-20 MED ORDER — LIDOCAINE HCL (CARDIAC) PF 100 MG/5ML IV SOSY
PREFILLED_SYRINGE | INTRAVENOUS | Status: DC | PRN
  Administered 2018-05-20: 40 mg via INTRAVENOUS

## 2018-05-20 MED ORDER — LIDOCAINE HCL (PF) 0.5 % IJ SOLN
INTRAMUSCULAR | Status: DC | PRN
  Administered 2018-05-20: 50 mL via INTRAVENOUS

## 2018-05-20 MED ORDER — ACETAMINOPHEN 500 MG PO TABS
ORAL_TABLET | ORAL | Status: AC
  Filled 2018-05-20: qty 2

## 2018-05-20 MED ORDER — PROMETHAZINE HCL 25 MG/ML IJ SOLN: 6.2500 mg | INTRAMUSCULAR | Status: DC | PRN

## 2018-05-20 MED ORDER — HYDROCODONE-ACETAMINOPHEN 7.5-325 MG PO TABS: 1.0000 | ORAL_TABLET | Freq: Once | ORAL | Status: DC | PRN

## 2018-05-20 MED ORDER — PROPOFOL 10 MG/ML IV BOLUS
INTRAVENOUS | Status: DC | PRN
  Administered 2018-05-20 (×2): 10 mg via INTRAVENOUS
  Administered 2018-05-20: 20 mg via INTRAVENOUS

## 2018-05-20 MED ORDER — MIDAZOLAM HCL 2 MG/2ML IJ SOLN
INTRAMUSCULAR | Status: AC
  Filled 2018-05-20: qty 2

## 2018-05-20 MED ORDER — DEXAMETHASONE SODIUM PHOSPHATE 10 MG/ML IJ SOLN
INTRAMUSCULAR | Status: DC | PRN
  Administered 2018-05-20: 5 mg via INTRAVENOUS

## 2018-05-20 MED ORDER — TRAMADOL HCL 50 MG PO TABS: 50.0000 mg | ORAL_TABLET | Freq: Three times a day (TID) | ORAL | 2 refills | Status: AC | PRN

## 2018-05-20 MED ORDER — PROMETHAZINE HCL 25 MG PO TABS: 25.0000 mg | ORAL_TABLET | Freq: Four times a day (QID) | ORAL | 1 refills | Status: DC | PRN

## 2018-05-20 MED ORDER — BUPIVACAINE HCL (PF) 0.25 % IJ SOLN
INTRAMUSCULAR | Status: DC | PRN
  Administered 2018-05-20: 6 mL

## 2018-05-20 MED ORDER — CHLORHEXIDINE GLUCONATE 4 % EX LIQD: 60.0000 mL | Freq: Once | CUTANEOUS | Status: DC

## 2018-05-20 MED ORDER — CEFAZOLIN SODIUM-DEXTROSE 2-4 GM/100ML-% IV SOLN
INTRAVENOUS | Status: AC
  Filled 2018-05-20: qty 100

## 2018-05-20 MED ORDER — SCOPOLAMINE 1 MG/3DAYS TD PT72: 1.0000 | MEDICATED_PATCH | Freq: Once | TRANSDERMAL | Status: DC | PRN

## 2018-05-20 MED ORDER — KETOROLAC TROMETHAMINE 30 MG/ML IJ SOLN
INTRAMUSCULAR | Status: DC | PRN
  Administered 2018-05-20: 30 mg via INTRAVENOUS

## 2018-05-20 MED ORDER — FENTANYL CITRATE (PF) 100 MCG/2ML IJ SOLN
50.0000 ug | INTRAMUSCULAR | Status: DC | PRN
  Administered 2018-05-20: 100 ug via INTRAVENOUS

## 2018-05-20 MED ORDER — ACETAMINOPHEN 500 MG PO TABS
1000.0000 mg | ORAL_TABLET | Freq: Once | ORAL | Status: AC
  Administered 2018-05-20: 1000 mg via ORAL

## 2018-05-20 MED ORDER — LACTATED RINGERS IV SOLN
INTRAVENOUS | Status: DC
  Administered 2018-05-20: 12:00:00 via INTRAVENOUS

## 2018-05-20 MED ORDER — FENTANYL CITRATE (PF) 100 MCG/2ML IJ SOLN
INTRAMUSCULAR | Status: AC
  Filled 2018-05-20: qty 2

## 2018-05-20 MED ORDER — MIDAZOLAM HCL 2 MG/2ML IJ SOLN
1.0000 mg | INTRAMUSCULAR | Status: DC | PRN
  Administered 2018-05-20: 2 mg via INTRAVENOUS

## 2018-05-20 MED ORDER — MEPERIDINE HCL 25 MG/ML IJ SOLN: 6.2500 mg | INTRAMUSCULAR | Status: DC | PRN

## 2018-05-20 MED FILL — PROMETHAZINE 25 MG TABLET: 25 | 8 days supply | Qty: 30 | Fill #0

## 2018-05-20 MED FILL — traMADol HCL 50 MG TABS: 50 | 5 days supply | Qty: 30 | Fill #0

## 2018-05-20 SURGICAL SUPPLY — 55 items
BANDAGE ACE 3X5.8 VEL STRL LF (GAUZE/BANDAGES/DRESSINGS) ×3 IMPLANT
BLADE HEX COATED 2.75 (ELECTRODE) IMPLANT
BLADE SURG 15 STRL LF DISP TIS (BLADE) ×1 IMPLANT
BLADE SURG 15 STRL SS (BLADE) ×2
BNDG ESMARK 4X9 LF (GAUZE/BANDAGES/DRESSINGS) IMPLANT
BRUSH SCRUB EZ PLAIN DRY (MISCELLANEOUS) ×3 IMPLANT
CANISTER SUCT 1200ML W/VALVE (MISCELLANEOUS) IMPLANT
CORD BIPOLAR FORCEPS 12FT (ELECTRODE) ×3 IMPLANT
COVER BACK TABLE 60X90IN (DRAPES) ×3 IMPLANT
CUFF TOURNIQUET SINGLE 18IN (TOURNIQUET CUFF) ×3 IMPLANT
DECANTER SPIKE VIAL GLASS SM (MISCELLANEOUS) IMPLANT
DRAPE EXTREMITY T 121X128X90 (DRAPE) ×3 IMPLANT
DRAPE IMP U-DRAPE 54X76 (DRAPES) ×3 IMPLANT
DRAPE SURG 17X23 STRL (DRAPES) ×3 IMPLANT
ELECT REM PT RETURN 9FT ADLT (ELECTROSURGICAL)
ELECTRODE REM PT RTRN 9FT ADLT (ELECTROSURGICAL) IMPLANT
GAUZE 4X4 16PLY RFD (DISPOSABLE) IMPLANT
GAUZE SPONGE 4X4 12PLY STRL (GAUZE/BANDAGES/DRESSINGS) ×3 IMPLANT
GAUZE XEROFORM 1X8 LF (GAUZE/BANDAGES/DRESSINGS) ×3 IMPLANT
GLOVE BIOGEL PI IND STRL 7.0 (GLOVE) ×1 IMPLANT
GLOVE BIOGEL PI INDICATOR 7.0 (GLOVE) ×2
GLOVE ECLIPSE 6.5 STRL STRAW (GLOVE) ×3 IMPLANT
GLOVE ECLIPSE 7.0 STRL STRAW (GLOVE) ×3 IMPLANT
GLOVE EXAM NITRILE MD LF STRL (GLOVE) ×3 IMPLANT
GLOVE SKINSENSE NS SZ7.5 (GLOVE) ×2
GLOVE SKINSENSE STRL SZ7.5 (GLOVE) ×1 IMPLANT
GLOVE SURG SYN 7.5  E (GLOVE) ×2
GLOVE SURG SYN 7.5 E (GLOVE) ×1 IMPLANT
GOWN STRL REIN XL XLG (GOWN DISPOSABLE) ×3 IMPLANT
GOWN STRL REUS W/ TWL LRG LVL3 (GOWN DISPOSABLE) ×2 IMPLANT
GOWN STRL REUS W/ TWL XL LVL3 (GOWN DISPOSABLE) ×1 IMPLANT
GOWN STRL REUS W/TWL LRG LVL3 (GOWN DISPOSABLE) ×4
GOWN STRL REUS W/TWL XL LVL3 (GOWN DISPOSABLE) ×2
NEEDLE HYPO 25X1 1.5 SAFETY (NEEDLE) ×3 IMPLANT
NS IRRIG 1000ML POUR BTL (IV SOLUTION) ×3 IMPLANT
PACK BASIN DAY SURGERY FS (CUSTOM PROCEDURE TRAY) ×3 IMPLANT
PAD CAST 3X4 CTTN HI CHSV (CAST SUPPLIES) ×1 IMPLANT
PADDING CAST COTTON 3X4 STRL (CAST SUPPLIES) ×2
PENCIL BUTTON HOLSTER BLD 10FT (ELECTRODE) IMPLANT
RUBBERBAND STERILE (MISCELLANEOUS) ×6 IMPLANT
SPLINT UNIVERSAL RIGHT (SOFTGOODS) IMPLANT
SPLINT WRIST 10 LEFT UNV (SOFTGOODS) IMPLANT
SPONGE LAP 18X18 RF (DISPOSABLE) IMPLANT
STOCKINETTE 4X48 STRL (DRAPES) ×3 IMPLANT
SUCTION FRAZIER HANDLE 10FR (MISCELLANEOUS)
SUCTION TUBE FRAZIER 10FR DISP (MISCELLANEOUS) IMPLANT
SUT ETHILON 4 0 PS 2 18 (SUTURE) ×3 IMPLANT
SUT VIC AB 0 CT1 27 (SUTURE)
SUT VIC AB 0 CT1 27XBRD ANBCTR (SUTURE) IMPLANT
SUT VIC AB 2-0 CT1 27 (SUTURE)
SUT VIC AB 2-0 CT1 TAPERPNT 27 (SUTURE) IMPLANT
SYR BULB 3OZ (MISCELLANEOUS) ×3 IMPLANT
SYR CONTROL 10ML LL (SYRINGE) ×3 IMPLANT
TOWEL GREEN STERILE FF (TOWEL DISPOSABLE) ×3 IMPLANT
TOWEL OR NON WOVEN STRL DISP B (DISPOSABLE) IMPLANT

## 2018-05-20 NOTE — Transfer of Care (Signed)
Immediate Anesthesia Transfer of Care Note  Patient: Audrey Beck  Procedure(s) Performed: EXCISION OF DORSAL GANGLION CYST LEFT WRIST (Left Wrist)  Patient Location: PACU  Anesthesia Type:General  Level of Consciousness: awake and sedated  Airway & Oxygen Therapy: Patient Spontanous Breathing and Patient connected to face mask oxygen  Post-op Assessment: Report given to RN and Post -op Vital signs reviewed and stable  Post vital signs: Reviewed and stable  Last Vitals:  Vitals Value Taken Time  BP    Temp    Pulse 65 05/20/2018  1:28 PM  Resp 0 05/20/2018  1:28 PM  SpO2 100 % 05/20/2018  1:28 PM  Vitals shown include unvalidated device data.  Last Pain:  Vitals:   05/20/18 1140  TempSrc: Oral  PainSc: 7       Patients Stated Pain Goal: 3 (36/68/15 9470)  Complications: No apparent anesthesia complications

## 2018-05-20 NOTE — Anesthesia Postprocedure Evaluation (Signed)
Anesthesia Post Note  Patient: Audrey Beck  Procedure(s) Performed: EXCISION OF DORSAL GANGLION CYST LEFT WRIST (Left Wrist)     Patient location during evaluation: PACU Anesthesia Type: MAC Level of consciousness: awake and alert Pain management: pain level controlled Vital Signs Assessment: post-procedure vital signs reviewed and stable Respiratory status: spontaneous breathing, nonlabored ventilation, respiratory function stable and patient connected to nasal cannula oxygen Cardiovascular status: stable and blood pressure returned to baseline Postop Assessment: no apparent nausea or vomiting Anesthetic complications: no    Last Vitals:  Vitals:   05/20/18 1330 05/20/18 1345  BP: 105/78   Pulse: 64   Resp: 15 20  Temp: 36.7 C   SpO2: 100%     Last Pain:  Vitals:   05/20/18 1330  TempSrc:   PainSc: 0-No pain                 Barnet Glasgow

## 2018-05-20 NOTE — H&P (Signed)
    PREOPERATIVE H&P  Chief Complaint: left wrist dorsal ganglion cyst  HPI: Audrey Beck is a 52 y.o. female who presents for surgical treatment of left wrist dorsal ganglion cyst.  She denies any changes in medical history.  Past Medical History:  Diagnosis Date  . Diverticulitis   . Polyp of colon    Past Surgical History:  Procedure Laterality Date  . ABDOMINAL HYSTERECTOMY     partial  . BACK SURGERY     2001  . FOOT FRACTURE SURGERY     Social History   Socioeconomic History  . Marital status: Single    Spouse name: Not on file  . Number of children: Not on file  . Years of education: Not on file  . Highest education level: Not on file  Occupational History  . Not on file  Social Needs  . Financial resource strain: Not on file  . Food insecurity:    Worry: Not on file    Inability: Not on file  . Transportation needs:    Medical: Not on file    Non-medical: Not on file  Tobacco Use  . Smoking status: Current Every Day Smoker    Packs/day: 0.50    Types: Cigarettes  . Smokeless tobacco: Never Used  . Tobacco comment: pt has not had a cigarette in 9 days  Substance and Sexual Activity  . Alcohol use: Yes    Comment: socially  . Drug use: Yes    Types: Marijuana    Comment: occ  . Sexual activity: Not Currently  Lifestyle  . Physical activity:    Days per week: Not on file    Minutes per session: Not on file  . Stress: Not on file  Relationships  . Social connections:    Talks on phone: Not on file    Gets together: Not on file    Attends religious service: Not on file    Active member of club or organization: Not on file    Attends meetings of clubs or organizations: Not on file    Relationship status: Not on file  Other Topics Concern  . Not on file  Social History Narrative  . Not on file   History reviewed. No pertinent family history. No Known Allergies Prior to Admission medications   Medication Sig Start Date End Date Taking?  Authorizing Provider  lovastatin (MEVACOR) 40 MG tablet Take 1 tablet (40 mg total) by mouth at bedtime. 04/28/18  Yes Clent Demark, PA-C     Positive ROS: All other systems have been reviewed and were otherwise negative with the exception of those mentioned in the HPI and as above.  Physical Exam: General: Alert, no acute distress Cardiovascular: No pedal edema Respiratory: No cyanosis, no use of accessory musculature GI: abdomen soft Skin: No lesions in the area of chief complaint Neurologic: Sensation intact distally Psychiatric: Patient is competent for consent with normal mood and affect Lymphatic: no lymphedema  MUSCULOSKELETAL: exam stable  Assessment: left wrist dorsal ganglion cyst  Plan: Plan for Procedure(s): REMOVAL GANGLION OF LEFT WRIST  The risks benefits and alternatives were discussed with the patient including but not limited to the risks of nonoperative treatment, versus surgical intervention including infection, bleeding, nerve injury,  blood clots, cardiopulmonary complications, morbidity, mortality, among others, and they were willing to proceed.   Eduard Roux, MD   05/20/2018 8:12 AM

## 2018-05-20 NOTE — Anesthesia Procedure Notes (Signed)
Anesthesia Regional Block: Bier block (IV Regional)   Pre-Anesthetic Checklist: ,, timeout performed, Correct Patient, Correct Site, Correct Laterality, Correct Procedure,, site marked, surgical consent,, at surgeon's request  Laterality: Left and Upper     Needles:  Injection technique: Single-shot  Needle Type: Other      Needle Gauge: 20     Additional Needles:   Procedures:,,,,, intact distal pulses, Esmarch exsanguination,, double tourniquet utilized  Narrative:   Performed by: [employer]

## 2018-05-20 NOTE — Anesthesia Procedure Notes (Signed)
Procedure Name: MAC Performed by: Zriyah Kopplin W, CRNA Pre-anesthesia Checklist: Patient identified, Timeout performed, Emergency Drugs available, Suction available and Patient being monitored Oxygen Delivery Method: Simple face mask       

## 2018-05-20 NOTE — Op Note (Signed)
   Date of Surgery: 05/20/2018  INDICATIONS: Ms. Dejoseph is a 52 y.o.-year-old female with a left wrist ganglion cyst;  The patient did consent to the procedure after discussion of the risks and benefits.  PREOPERATIVE DIAGNOSIS: Left dorsal wrist ganglion cyst  POSTOPERATIVE DIAGNOSIS: Same.  PROCEDURE:  1.  Removal of left dorsal wrist ganglion cyst 2.  Tenolysis of left extensor tendons x 4  SURGEON: N. Eduard Roux, M.D.  ASSIST: Ciro Backer [place], [place]; necessary for the timely completion of procedure and due to complexity of procedure.  ANESTHESIA: Bier block  IV FLUIDS AND URINE: See anesthesia.  ESTIMATED BLOOD LOSS: Minimal mL.  IMPLANTS: None  DRAINS: None  COMPLICATIONS: None.  DESCRIPTION OF PROCEDURE: The patient was brought to the operating room and placed supine on the operating table.  The patient had been signed prior to the procedure and this was documented. The patient had the anesthesia placed by the anesthesiologist.  A time-out was performed to confirm that this was the correct patient, site, side and location. The patient did receive antibiotics prior to the incision and was re-dosed during the procedure as needed at indicated intervals.  A tourniquet was placed.  The patient had the operative extremity prepped and draped in the standard surgical fashion.    A longitudinal incision was placed directly over the palpable cyst.  Dissection was carried down through the subcutaneous tissue.  Retractors were placed for visualization.  We then identified the small dorsal wrist ganglion cyst and this was removed entirely using combination of blunt and sharp dissection.  This was traced down through the extensor retinaculum which was opened up.  Tenolysis of the fourth dorsal wrist compartment was performed.  The tendons themselves did not exhibit any tearing.  The cyst was removed and sent to pathology.  Hemostasis was obtained.  Thorough irrigation was performed.  Skin was  closed with 3-0 nylon sutures.  Sterile dressings were applied.  Patient tolerated procedure well had no immediate comp occasions.  POSTOPERATIVE PLAN: Follow-up in office in 2 weeks.  Azucena Cecil, MD [place] [phone] 1:15 PM

## 2018-05-20 NOTE — Discharge Instructions (Signed)
Postoperative instructions:  Weightbearing instructions: as tolerated. Begin finger range of motion immediately  Dressing instructions: Keep your dressing and/or splint clean and dry at all times.  It will be removed at your first post-operative appointment.  Your stitches and/or staples will be removed at this visit.  Incision instructions:  Do not soak your incision for 3 weeks after surgery.  If the incision gets wet, pat dry and do not scrub the incision.  Pain control:  You have been given a prescription to be taken as directed for post-operative pain control.  In addition, elevate the operative extremity above the heart at all times to prevent swelling and throbbing pain.  Take over-the-counter Colace, 100mg  by mouth twice a day while taking narcotic pain medications to help prevent constipation.  Follow up appointments: 1) 10-14 days for suture removal and wound check. 2) Dr. Erlinda Hong as scheduled.   -------------------------------------------------------------------------------------------------------------  After Surgery Pain Control:  After your surgery, post-surgical discomfort or pain is likely. This discomfort can last several days to a few weeks. At certain times of the day your discomfort may be more intense.  Did you receive a nerve block?  A nerve block can provide pain relief for one hour to two days after your surgery. As long as the nerve block is working, you will experience little or no sensation in the area the surgeon operated on.  As the nerve block wears off, you will begin to experience pain or discomfort. It is very important that you begin taking your prescribed pain medication before the nerve block fully wears off. Treating your pain at the first sign of the block wearing off will ensure your pain is better controlled and more tolerable when full-sensation returns. Do not wait until the pain is intolerable, as the medicine will be less effective. It is better to treat  pain in advance than to try and catch up.  General Anesthesia:  If you did not receive a nerve block during your surgery, you will need to start taking your pain medication shortly after your surgery and should continue to do so as prescribed by your surgeon.  Pain Medication:  Most commonly we prescribe Vicodin and Percocet for post-operative pain. Both of these medications contain a combination of acetaminophen (Tylenol) and a narcotic to help control pain.   It takes between 30 and 45 minutes before pain medication starts to work. It is important to take your medication before your pain level gets too intense.   Nausea is a common side effect of many pain medications. You will want to eat something before taking your pain medicine to help prevent nausea.   If you are taking a prescription pain medication that contains acetaminophen, we recommend that you do not take additional over the counter acetaminophen (Tylenol).  Other pain relieving options:   Using a cold pack to ice the affected area a few times a day (15 to 20 minutes at a time) can help to relieve pain, reduce swelling and bruising.   Elevation of the affected area can also help to reduce pain and swelling.       Post Anesthesia Home Care Instructions  NO IBUPROFEN UNTIL AFTER 700PM 05/20/18.  Activity: Get plenty of rest for the remainder of the day. A responsible individual must stay with you for 24 hours following the procedure.  For the next 24 hours, DO NOT: -Drive a car -Paediatric nurse -Drink alcoholic beverages -Take any medication unless instructed by your physician -Make any legal  decisions or sign important papers.  Meals: Start with liquid foods such as gelatin or soup. Progress to regular foods as tolerated. Avoid greasy, spicy, heavy foods. If nausea and/or vomiting occur, drink only clear liquids until the nausea and/or vomiting subsides. Call your physician if vomiting continues.  Special  Instructions/Symptoms: Your throat may feel dry or sore from the anesthesia or the breathing tube placed in your throat during surgery. If this causes discomfort, gargle with warm salt water. The discomfort should disappear within 24 hours.  If you had a scopolamine patch placed behind your ear for the management of post- operative nausea and/or vomiting:  1. The medication in the patch is effective for 72 hours, after which it should be removed.  Wrap patch in a tissue and discard in the trash. Wash hands thoroughly with soap and water. 2. You may remove the patch earlier than 72 hours if you experience unpleasant side effects which may include dry mouth, dizziness or visual disturbances. 3. Avoid touching the patch. Wash your hands with soap and water after contact with the patch.

## 2018-05-21 DIAGNOSIS — M67432 Ganglion, left wrist: Secondary | ICD-10-CM

## 2018-05-21 DIAGNOSIS — M659 Synovitis and tenosynovitis, unspecified: Secondary | ICD-10-CM

## 2018-05-23 ENCOUNTER — Telehealth (INDEPENDENT_AMBULATORY_CARE_PROVIDER_SITE_OTHER): Payer: Self-pay | Admitting: Orthopaedic Surgery

## 2018-05-23 ENCOUNTER — Encounter (INDEPENDENT_AMBULATORY_CARE_PROVIDER_SITE_OTHER): Payer: Self-pay

## 2018-05-23 NOTE — Telephone Encounter (Signed)
See message below,

## 2018-05-23 NOTE — Telephone Encounter (Signed)
Note made.  

## 2018-05-23 NOTE — Telephone Encounter (Signed)
Out of work 2 weeks.  Then may return to work.  May not lift more than 5 lbs and may not use hand and wrist repetitive.ly

## 2018-05-23 NOTE — Telephone Encounter (Signed)
Patient requesting a letter for work be mailed to her stating how long she'll be out of work and what restrictions she'll have when she goes back. Her address :  [address] [place] [address]  Patients # [phone]

## 2018-05-24 ENCOUNTER — Ambulatory Visit (INDEPENDENT_AMBULATORY_CARE_PROVIDER_SITE_OTHER): Payer: Self-pay | Admitting: Physician Assistant

## 2018-05-24 ENCOUNTER — Encounter: Payer: Self-pay | Admitting: Physician Assistant

## 2018-05-24 VITALS — BP 110/70 | HR 95 | Ht 66.0 in | Wt 163.0 lb

## 2018-05-24 DIAGNOSIS — Z8719 Personal history of other diseases of the digestive system: Secondary | ICD-10-CM

## 2018-05-24 DIAGNOSIS — R1032 Left lower quadrant pain: Secondary | ICD-10-CM

## 2018-05-24 DIAGNOSIS — Z86010 Personal history of colonic polyps: Secondary | ICD-10-CM

## 2018-05-24 MED ORDER — NA SULFATE-K SULFATE-MG SULF 17.5-3.13-1.6 GM/177ML PO SOLN: ORAL | 0 refills | Status: DC

## 2018-05-24 NOTE — Telephone Encounter (Signed)
Will mail out to her.

## 2018-05-24 NOTE — Patient Instructions (Signed)
If you are age 54 or older, your body mass index should be between 23-30. Your Body mass index is 26.31 kg/m. If this is out of the aforementioned range listed, please consider follow up with your Primary Care Provider.  If you are age 5 or younger, your body mass index should be between 19-25. Your Body mass index is 26.31 kg/m. If this is out of the aformentioned range listed, please consider follow up with your Primary Care Provider.   You have been scheduled for a colonoscopy. Please follow written instructions given to you at your visit today.  Please pick up your prep supplies at the pharmacy within the next 1-3 days. If you use inhalers (even only as needed), please bring them with you on the day of your procedure. Your physician has requested that you go to www.startemmi.com and enter the access code given to you at your visit today. This web site gives a general overview about your procedure. However, you should still follow specific instructions given to you by our office regarding your preparation for the procedure.  We have sent the following medications to your pharmacy for you to pick up at your convenience: Suprep  We are requesting records from [facility]  INCREASE Miralax to twice daily.  Continue high fiber 25-35 grams daily.  Thank you for choosing me and [facility].   Ellouise Newer, PA-C

## 2018-05-24 NOTE — Progress Notes (Signed)
Reviewed and agree with documentation and assessment and plan. K. Veena Johnel Yielding , MD   

## 2018-05-24 NOTE — Progress Notes (Addendum)
Chief Complaint: History of recurrent diverticulitis, history of polyps  HPI:    Audrey Beck is a 52 year old female, who was referred to me by Clent Demark, PA-C for a complaint of recurrent diverticulitis and a history of colon polyps.      12/03/2017 CT abdomen and pelvis showed focal wall thickening with adjacent inflammation of the distal descending colon compatible with acute diverticulitis.    Today, patient tells me that over the past 11 years she has had one episode of diverticulitis every year except for one.  These have always cleared with Ciprofloxacin and Flagyl for about a 2-week time.  She has never had to be admitted to the hospital.  Describes that this year she has had 2 episodes so far one in March and one a month ago which again cleared with Cipro and Flagyl.  Her typical episodes of diverticulitis are noted by an increase in her hot flashes, a change in her bowel habits and a 9/10 pain in her left lower quadrant.  In between these episodes patient does use MiraLAX every morning and Benefiber twice a day to try and keep her bowel movements more regular.  Currently tells me that since her last episode of diverticulitis she has only been having a bowel movement once every 2 days which is a little bit smaller than normal and hard to pass.  Patient was told by her PCP to come and get checked out.    Past medical history is positive for colonoscopy in [place] about 5 years ago.  She does report a history of polyps and was told she may repeat in 5 years.    Denies fever, chills, weight loss, anorexia, nausea, vomiting, heartburn, reflux or symptoms that awaken her from sleep.  Past Medical History:  Diagnosis Date  . Diverticulitis   . Polyp of colon     Past Surgical History:  Procedure Laterality Date  . ABDOMINAL HYSTERECTOMY     partial  . BACK SURGERY     2001  . FOOT FRACTURE SURGERY    . GANGLION CYST EXCISION Left 05/20/2018   Procedure: EXCISION OF DORSAL  GANGLION CYST LEFT WRIST;  Surgeon: Leandrew Koyanagi, MD;  Location: [facility];  Service: Orthopedics;  Laterality: Left;  Bier block    Current Outpatient Medications  Medication Sig Dispense Refill  . lovastatin (MEVACOR) 40 MG tablet Take 1 tablet (40 mg total) by mouth at bedtime. 30 tablet 11  . traMADol (ULTRAM) 50 MG tablet Take 1-2 tablets (50-100 mg total) by mouth 3 (three) times daily as needed. 30 tablet 2   No current facility-administered medications for this visit.     Allergies as of 05/24/2018  . (No Known Allergies)    Family History  Problem Relation Age of Onset  . Stomach cancer Mother   . Esophageal cancer Father   . Colon cancer Neg Hx   . Pancreatic cancer Neg Hx     Social History   Socioeconomic History  . Marital status: Single    Spouse name: Not on file  . Number of children: 2  . Years of education: Not on file  . Highest education level: Not on file  Occupational History  . Not on file  Social Needs  . Financial resource strain: Not on file  . Food insecurity:    Worry: Not on file    Inability: Not on file  . Transportation needs:    Medical: Not on file  Non-medical: Not on file  Tobacco Use  . Smoking status: Current Some Day Smoker    Packs/day: 0.50    Types: Cigarettes  . Smokeless tobacco: Never Used  . Tobacco comment: pt has not had a cigarette in 9 days  Substance and Sexual Activity  . Alcohol use: Yes    Comment: socially  . Drug use: Yes    Types: Marijuana    Comment: occ  . Sexual activity: Not Currently  Lifestyle  . Physical activity:    Days per week: Not on file    Minutes per session: Not on file  . Stress: Not on file  Relationships  . Social connections:    Talks on phone: Not on file    Gets together: Not on file    Attends religious service: Not on file    Active member of club or organization: Not on file    Attends meetings of clubs or organizations: Not on file    Relationship  status: Not on file  . Intimate partner violence:    Fear of current or ex partner: Not on file    Emotionally abused: Not on file    Physically abused: Not on file    Forced sexual activity: Not on file  Other Topics Concern  . Not on file  Social History Narrative  . Not on file    Review of Systems:    Constitutional: No weight loss, fever or chills Skin: No rash  Cardiovascular: No chest pain  Respiratory: No SOB  Gastrointestinal: See HPI and otherwise negative Genitourinary: No dysuria  Neurological: No headache Musculoskeletal: No new muscle or joint pain Hematologic: No bleeding  Psychiatric: No history of depression or anxiety   Physical Exam:  Vital signs: BP 110/70   Pulse 95   Ht 5\' 6"  (1.676 m)   Wt 163 lb (73.9 kg)   BMI 26.31 kg/m   Constitutional:   Pleasant AA female appears to be in NAD, Well developed, Well nourished, alert and cooperative Head:  Normocephalic and atraumatic. Eyes:   PEERL, EOMI. No icterus. Conjunctiva pink. Ears:  Normal auditory acuity. Neck:  Supple Throat: Oral cavity and pharynx without inflammation, swelling or lesion.  Respiratory: Respirations even and unlabored. Lungs clear to auscultation bilaterally.   No wheezes, crackles, or rhonchi.  Cardiovascular: Normal S1, S2. No MRG. Regular rate and rhythm. No peripheral edema, cyanosis or pallor.  Gastrointestinal:  Soft, nondistended, mild LLQ ttp, No rebound or guarding. Normal bowel sounds. No appreciable masses or hepatomegaly. Rectal:  Not performed.  Msk:  Symmetrical without gross deformities. Without edema, no deformity or joint abnormality.  Neurologic:  Alert and  oriented x4;  grossly normal neurologically.  Skin:   Dry and intact without significant lesions or rashes. Psychiatric: Demonstrates good judgement and reason without abnormal affect or behaviors.  MOST RECENT LABS AND IMAGING: CBC    Component Value Date/Time   WBC 18.3 (H) 12/03/2017 1342   RBC 4.45  12/03/2017 1342   HGB 13.8 12/03/2017 1342   HGB 12.7 11/22/2017 1407   HCT 41.3 12/03/2017 1342   HCT 39.2 11/22/2017 1407   PLT 397 12/03/2017 1342   PLT 454 (H) 11/22/2017 1407   MCV 92.8 12/03/2017 1342   MCV 94 11/22/2017 1407   MCH 31.0 12/03/2017 1342   MCHC 33.4 12/03/2017 1342   RDW 14.2 12/03/2017 1342   RDW 13.8 11/22/2017 1407   LYMPHSABS 4.2 (H) 11/22/2017 1407   MONOABS 0.9 02/18/2016 1056  EOSABS 0.2 11/22/2017 1407   BASOSABS 0.0 11/22/2017 1407    CMP     Component Value Date/Time   NA 136 12/03/2017 1342   NA 138 11/22/2017 1407   K 4.4 12/03/2017 1342   CL 103 12/03/2017 1342   CO2 21 (L) 12/03/2017 1342   GLUCOSE 109 (H) 12/03/2017 1342   BUN 11 12/03/2017 1342   BUN 12 11/22/2017 1407   CREATININE 0.85 12/03/2017 1342   CALCIUM 9.5 12/03/2017 1342   PROT 8.4 (H) 12/03/2017 1342   PROT 7.5 11/22/2017 1407   ALBUMIN 4.3 12/03/2017 1342   ALBUMIN 4.5 11/22/2017 1407   AST 21 12/03/2017 1342   ALT 17 12/03/2017 1342   ALKPHOS 79 12/03/2017 1342   BILITOT 0.9 12/03/2017 1342   BILITOT 0.4 11/22/2017 1407   GFRNONAA >60 12/03/2017 1342   GFRAA >60 12/03/2017 1342    Assessment: 1.  Recurrent diverticulitis: 10 episodes over the past 11 years per patient, all cleared with Cipro and Flagyl outpatient, CT evidence recently in March of diverticulitis, repeat episode about a month ago 2.  History of colon polyps: Last colonoscopy 5 years ago per patient, she was told to repeat in 5 years due to 3 polyps (we do not have report at time of office visit) 3. LLQ pain  Plan: 1.  Scheduled patient for a colonoscopy for surveillance of colon polyps as well as history of recurrent diverticulitis.  Scheduled this with Dr. Silverio Decamp in [facility].  Did discuss risks,benefits, limitations and alternatives.  This was scheduled a month out from now as patient finished antibiotics about 2 weeks ago for diverticulitis. 2.  Recommend patient maintain high-fiber diet, 25 to 35 g  in between episodes of diverticulitis.  Discussed fiber supplements. 3.  Discussed that she can increase her MiraLAX up to 4 times a day as needed to have more regular bowel movements.  Could also add a stool softener if the stools are hard. 4.  We will obtain records from patient's last colonoscopy 5 years ago. 5.  Patient to follow in clinic per recommendations from Dr. Silverio Decamp after time of procedure.  Audrey Newer, PA-C [facility] 05/24/2018, 8:45 AM  Cc: Clent Demark, PA-C   Addendum: 06/29/2018 1514  05/20/2012 colonoscopy at [facility] by Dr. Malva Cogan.  The perianal and digital rectal examinations are normal, 5 sessile polyps were found in the sigmoid colon, polyps are 1-2 mm in size, polyps removed with a cold biceps biopsy forceps.  Resection and retrieval were complete.  A few medium mouth diverticula were found in the sigmoid colon, a few medium mouth diverticula were found in the descending colon to cecum, exam was otherwise without abnormality repeat was recommended in 5 years for surveillance.  Pathology showed hyperplastic polyps.  Audrey Newer, PA-C

## 2018-05-31 MED FILL — traMADol HCL 50 MG TABS: 50 | 5 days supply | Qty: 30 | Fill #1

## 2018-05-31 MED FILL — LOVASTATIN 40 MG TABS: 40 | 30 days supply | Qty: 30 | Fill #1

## 2018-06-03 ENCOUNTER — Encounter (INDEPENDENT_AMBULATORY_CARE_PROVIDER_SITE_OTHER): Payer: Self-pay | Admitting: Orthopaedic Surgery

## 2018-06-03 ENCOUNTER — Ambulatory Visit (INDEPENDENT_AMBULATORY_CARE_PROVIDER_SITE_OTHER): Payer: Self-pay | Admitting: Orthopaedic Surgery

## 2018-06-03 DIAGNOSIS — M67432 Ganglion, left wrist: Secondary | ICD-10-CM

## 2018-06-03 NOTE — Progress Notes (Signed)
Patient is two-week status post left wrist dorsal ganglion cyst removal.  Overall she is doing well.  Denies any pain.  She takes Aleve as needed.  Surgical incision has healed without any evidence of infection.  She does have a little bit of postoperative swelling localized to the surgical bed.  No signs of infection.  I recommend ice and heat to help resolve this.  I do recommend no repetitive use of the hand and wrist for work for the next 4 weeks.  Sutures were removed today.  Questions encouraged and answered.  Follow-up as needed.

## 2018-06-14 ENCOUNTER — Ambulatory Visit (HOSPITAL_COMMUNITY): Payer: Medicaid Other

## 2018-06-15 ENCOUNTER — Ambulatory Visit: Payer: Self-pay | Attending: Family Medicine

## 2018-06-21 ENCOUNTER — Ambulatory Visit (INDEPENDENT_AMBULATORY_CARE_PROVIDER_SITE_OTHER): Payer: Self-pay

## 2018-06-21 DIAGNOSIS — Z23 Encounter for immunization: Secondary | ICD-10-CM

## 2018-06-21 NOTE — Progress Notes (Signed)
Nurse visit for PPD placement.

## 2018-06-23 ENCOUNTER — Telehealth (INDEPENDENT_AMBULATORY_CARE_PROVIDER_SITE_OTHER): Payer: Self-pay | Admitting: Physician Assistant

## 2018-06-23 ENCOUNTER — Other Ambulatory Visit (INDEPENDENT_AMBULATORY_CARE_PROVIDER_SITE_OTHER): Payer: Self-pay | Admitting: Physician Assistant

## 2018-06-23 DIAGNOSIS — Z9189 Other specified personal risk factors, not elsewhere classified: Secondary | ICD-10-CM

## 2018-06-23 NOTE — Telephone Encounter (Signed)
Patient called to inform that she has been approve for financial assistance and would like to be referred to a dental clinic thru [facility]. Patient states that her gums continue to bleed and has to to put cotton balls in her mouth. Patient would need an urgent referral to be seen as soon as possible.  Please Advice [phone]  Thank you Emmit Pomfret

## 2018-06-23 NOTE — Telephone Encounter (Signed)
Notes have been faxed.  Audrey Beck

## 2018-06-23 NOTE — Telephone Encounter (Signed)
FWD to PCP. Tempestt S Roberts, CMA  

## 2018-06-23 NOTE — Telephone Encounter (Signed)
Referral has been made. Please fax the last office visit note to [phone].

## 2018-06-24 ENCOUNTER — Ambulatory Visit (INDEPENDENT_AMBULATORY_CARE_PROVIDER_SITE_OTHER): Payer: Medicaid Other

## 2018-06-24 ENCOUNTER — Encounter (INDEPENDENT_AMBULATORY_CARE_PROVIDER_SITE_OTHER): Payer: Self-pay

## 2018-06-28 ENCOUNTER — Telehealth: Payer: Self-pay | Admitting: Gastroenterology

## 2018-06-28 NOTE — Telephone Encounter (Signed)
Pt called back and said she found her instructions and she does not need a return call back

## 2018-06-29 MED FILL — LOVASTATIN 40 MG TABS: 40 | 30 days supply | Qty: 30 | Fill #2

## 2018-06-29 MED FILL — SUPREP BOWEL PREP KIT: 17.5-3.13-1 | 1 days supply | Qty: 354 | Fill #0

## 2018-07-04 ENCOUNTER — Ambulatory Visit (AMBULATORY_SURGERY_CENTER): Payer: Self-pay | Admitting: Gastroenterology

## 2018-07-04 ENCOUNTER — Encounter: Payer: Self-pay | Admitting: Gastroenterology

## 2018-07-04 VITALS — BP 102/64 | HR 81 | Temp 99.6°F | Resp 16 | Ht 66.0 in | Wt 163.0 lb

## 2018-07-04 DIAGNOSIS — Z86010 Personal history of colonic polyps: Secondary | ICD-10-CM

## 2018-07-04 DIAGNOSIS — K635 Polyp of colon: Secondary | ICD-10-CM

## 2018-07-04 DIAGNOSIS — D128 Benign neoplasm of rectum: Secondary | ICD-10-CM

## 2018-07-04 DIAGNOSIS — K621 Rectal polyp: Secondary | ICD-10-CM

## 2018-07-04 DIAGNOSIS — D125 Benign neoplasm of sigmoid colon: Secondary | ICD-10-CM

## 2018-07-04 MED ORDER — SODIUM CHLORIDE 0.9 % IV SOLN: 500.0000 mL | Freq: Once | INTRAVENOUS | Status: DC

## 2018-07-04 NOTE — Patient Instructions (Signed)
Handout given on polyps.  You had 2 removed today. Handouts on diverticulosis and high fiber diet and hemorrhoids.  YOU HAD AN ENDOSCOPIC PROCEDURE TODAY AT [facility]:   Refer to the procedure report that was given to you for any specific questions about what was found during the examination.  If the procedure report does not answer your questions, please call your gastroenterologist to clarify.  If you requested that your care partner not be given the details of your procedure findings, then the procedure report has been included in a sealed envelope for you to review at your convenience later.  YOU SHOULD EXPECT: Some feelings of bloating in the abdomen. Passage of more gas than usual.  Walking can help get rid of the air that was put into your GI tract during the procedure and reduce the bloating. If you had a lower endoscopy (such as a colonoscopy or flexible sigmoidoscopy) you may notice spotting of blood in your stool or on the toilet paper. If you underwent a bowel prep for your procedure, you may not have a normal bowel movement for a few days.  Please Note:  You might notice some irritation and congestion in your nose or some drainage.  This is from the oxygen used during your procedure.  There is no need for concern and it should clear up in a day or so.  SYMPTOMS TO REPORT IMMEDIATELY:   Following lower endoscopy (colonoscopy or flexible sigmoidoscopy):  Excessive amounts of blood in the stool  Significant tenderness or worsening of abdominal pains  Swelling of the abdomen that is new, acute  Fever of 100F or higher  Black, tarry-looking stools  For urgent or emergent issues, a gastroenterologist can be reached at any hour by calling [phone].   DIET:  We do recommend a small meal at first, but then you may proceed to your regular diet.  Drink plenty of fluids but you should avoid alcoholic beverages for 24 hours.  ACTIVITY:  You should plan to take it  easy for the rest of today and you should NOT DRIVE or use heavy machinery until tomorrow (because of the sedation medicines used during the test).    FOLLOW UP: Our staff will call the number listed on your records the next business day following your procedure to check on you and address any questions or concerns that you may have regarding the information given to you following your procedure. If we do not reach you, we will leave a message.  However, if you are feeling well and you are not experiencing any problems, there is no need to return our call.  We will assume that you have returned to your regular daily activities without incident.  If any biopsies were taken you will be contacted by phone or by letter within the next 1-3 weeks.  Please call us at [phone] if you have not heard about the biopsies in 3 weeks.    SIGNATURES/CONFIDENTIALITY: You and/or your care partner have signed paperwork which will be entered into your electronic medical record.  These signatures attest to the fact that that the information above on your After Visit Summary has been reviewed and is understood.  Full responsibility of the confidentiality of this discharge information lies with you and/or your care-partner.

## 2018-07-04 NOTE — Progress Notes (Signed)
To recovery, report to RN, VSS. 

## 2018-07-04 NOTE — Op Note (Signed)
[place] Patient Name: Audrey Beck Procedure Date: 07/04/2018 2:40 PM MRN: [id number] Endoscopist: Mauri Pole , MD Age: 52 Referring MD:  Date of Birth: 1966-02-21 Gender: Female Account #: [id number] Procedure:                Colonoscopy Indications:              High risk colon cancer surveillance: Personal                            history of colonic polyps Medicines:                Monitored Anesthesia Care Procedure:                Pre-Anesthesia Assessment:                           - Prior to the procedure, a History and Physical                            was performed, and patient medications and                            allergies were reviewed. The patient's tolerance of                            previous anesthesia was also reviewed. The risks                            and benefits of the procedure and the sedation                            options and risks were discussed with the patient.                            All questions were answered, and informed consent                            was obtained. Prior Anticoagulants: The patient has                            taken no previous anticoagulant or antiplatelet                            agents. ASA Grade Assessment: II - A patient with                            mild systemic disease. After reviewing the risks                            and benefits, the patient was deemed in                            satisfactory condition to undergo the procedure.  After obtaining informed consent, the colonoscope                            was passed under direct vision. Throughout the                            procedure, the patient's blood pressure, pulse, and                            oxygen saturations were monitored continuously. The                            Model PCF-H190DL [id number]) scope was introduced                            through the anus and advanced  to the the cecum,                            identified by appendiceal orifice and ileocecal                            valve. The colonoscopy was performed without                            difficulty. The patient tolerated the procedure                            well. The quality of the bowel preparation was                            good. The ileocecal valve, appendiceal orifice, and                            rectum were photographed. Scope In: 2:53:12 PM Scope Out: 3:09:39 PM Scope Withdrawal Time: 0 hours 10 minutes 44 seconds  Total Procedure Duration: 0 hours 16 minutes 27 seconds  Findings:                 The perianal and digital rectal examinations were                            normal.                           Multiple small and large-mouthed diverticula were                            found in the sigmoid colon and descending colon.                            There was narrowing of the colon in association                            with the diverticular opening. Peri-diverticular  erythema was seen. There was evidence of an                            impacted diverticulum.                           Two sessile polyps were found in the rectum and                            sigmoid colon. The polyps were 2 to 3 mm in size.                            These polyps were removed with a cold biopsy                            forceps. Resection and retrieval were complete.                           Non-bleeding internal hemorrhoids were found during                            retroflexion. The hemorrhoids were medium-sized. Complications:            No immediate complications. Estimated Blood Loss:     Estimated blood loss was minimal. Impression:               - Moderate diverticulosis in the sigmoid colon and                            in the descending colon. There was narrowing of the                            colon in association with the  diverticular opening.                            Peri-diverticular erythema was seen. There was                            evidence of an impacted diverticulum.                           - Two 2 to 3 mm polyps in the rectum and in the                            sigmoid colon, removed with a cold biopsy forceps.                            Resected and retrieved.                           - Non-bleeding internal hemorrhoids. Recommendation:           - Patient has a contact number available for  emergencies. The signs and symptoms of potential                            delayed complications were discussed with the                            patient. Return to normal activities tomorrow.                            Written discharge instructions were provided to the                            patient.                           - Resume previous diet.                           - Continue present medications.                           - Await pathology results.                           - Repeat colonoscopy in 5-10 years for surveillance                            based on pathology results. Mauri Pole, MD 07/04/2018 3:14:07 PM This report has been signed electronically.

## 2018-07-04 NOTE — Progress Notes (Signed)
Called to room to assist during endoscopic procedure.  Patient ID and intended procedure confirmed with present staff. Received instructions for my participation in the procedure from the performing physician.  

## 2018-07-05 ENCOUNTER — Telehealth: Payer: Self-pay | Admitting: *Deleted

## 2018-07-05 NOTE — Telephone Encounter (Signed)
No answer for post procedure call back. Left message and will attempt to call back later this afternoon. Sm 

## 2018-07-05 NOTE — Telephone Encounter (Signed)
  Follow up Call-  Call back number 07/04/2018  Post procedure Call Back phone  # [phone]  Permission to leave phone message Yes     Patient questions:  Do you have a fever, pain , or abdominal swelling? No. Pain Score  0 *  Have you tolerated food without any problems? Yes.    Have you been able to return to your normal activities? Yes.    Do you have any questions about your discharge instructions: Diet   No. Medications  No. Follow up visit  No.  Do you have questions or concerns about your Care? No.  Actions: * If pain score is 4 or above: No action needed, pain <4.

## 2018-07-12 ENCOUNTER — Encounter: Payer: Self-pay | Admitting: Gastroenterology

## 2018-08-30 ENCOUNTER — Ambulatory Visit (HOSPITAL_COMMUNITY): Payer: Medicaid Other

## 2019-12-08 IMAGING — CT CT ABD-PELV W/ CM
1 of 6 series · 4 of 46 positions shown, 5 images · IV contrast (agent unspecified)
Comparison: 02/18/2016 CT

CLINICAL DATA: 51-year-old female with acute abdominal pain for 1
day.

EXAM:
CT ABDOMEN AND PELVIS WITH CONTRAST
TECHNIQUE: Multidetector CT imaging of the abdomen and pelvis was performed
using the standard protocol following bolus administration of
intravenous contrast.
CONTRAST:  100 cc intravenous Usovue-JLL

[Series 1012: virtual unenhanced vnc mpr tra 2mm range (auto) · axial · 0.39mm/px · z∈[+1044,+1354]mm · 4 of 213 slices shown, 5 images]
[im 29/213  soft-tissue]
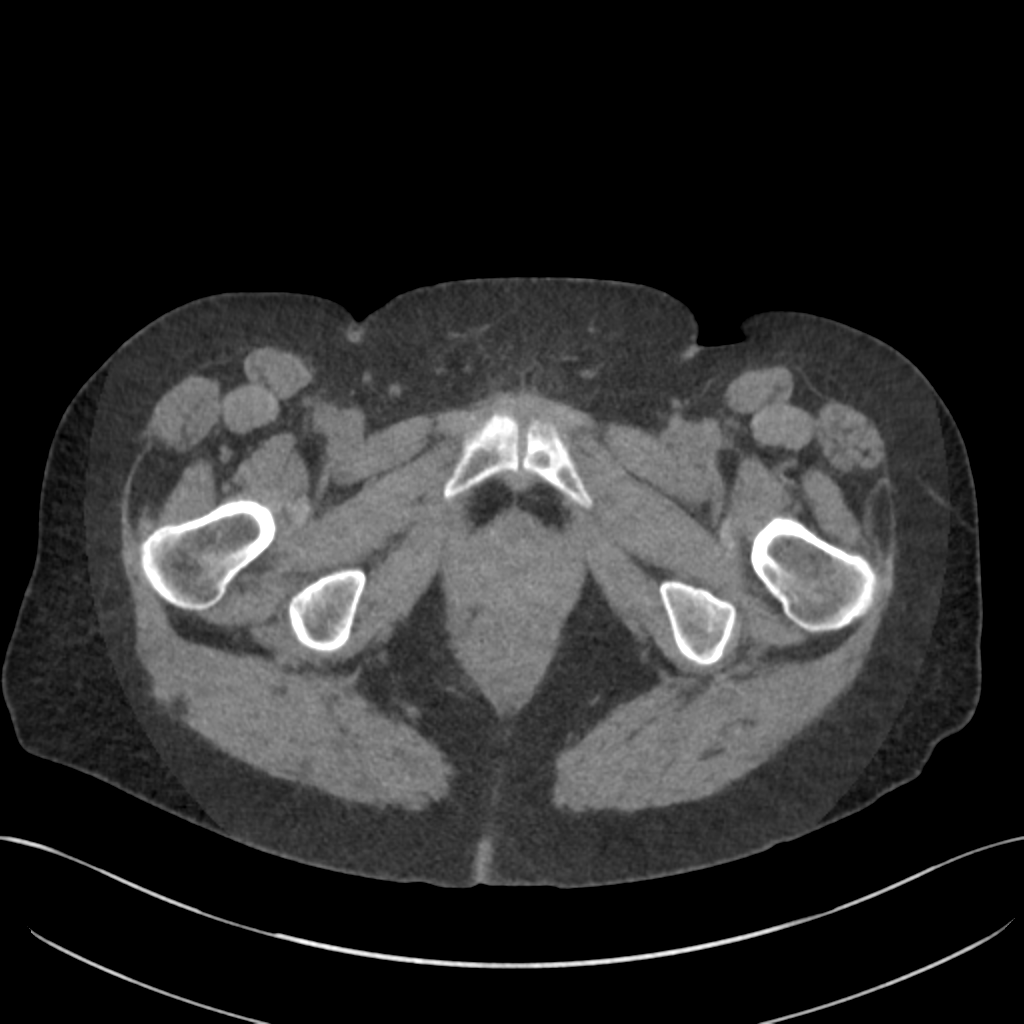
[im 29/213  bone]
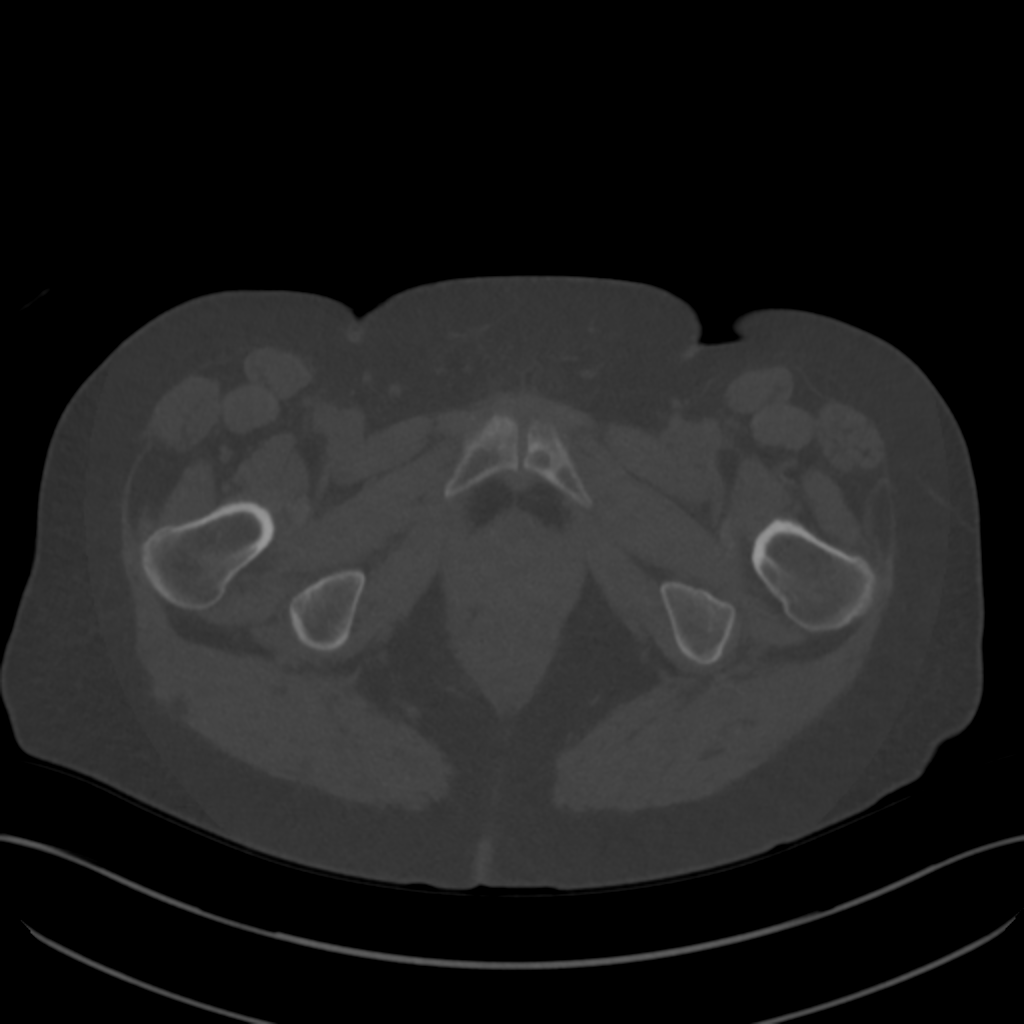
[im 85/213  soft-tissue]
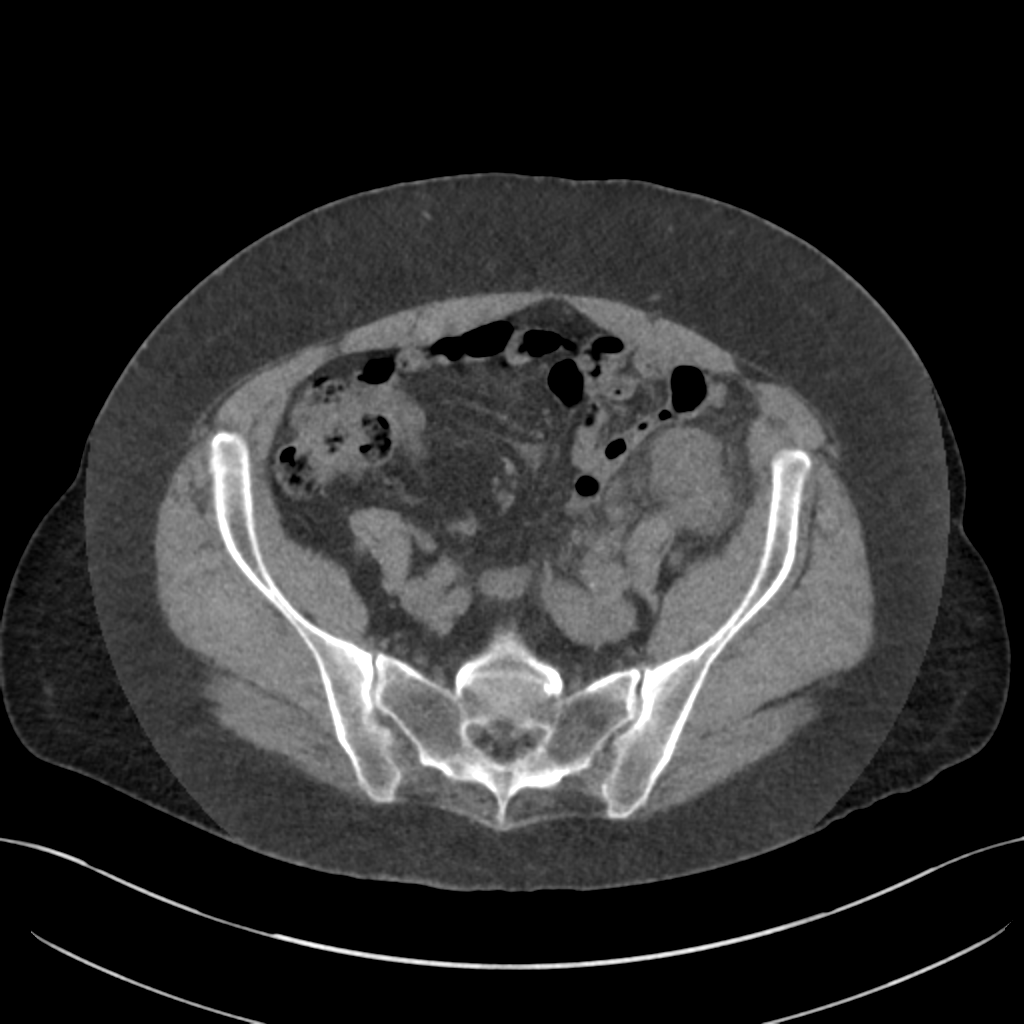
[im 128/213  soft-tissue]
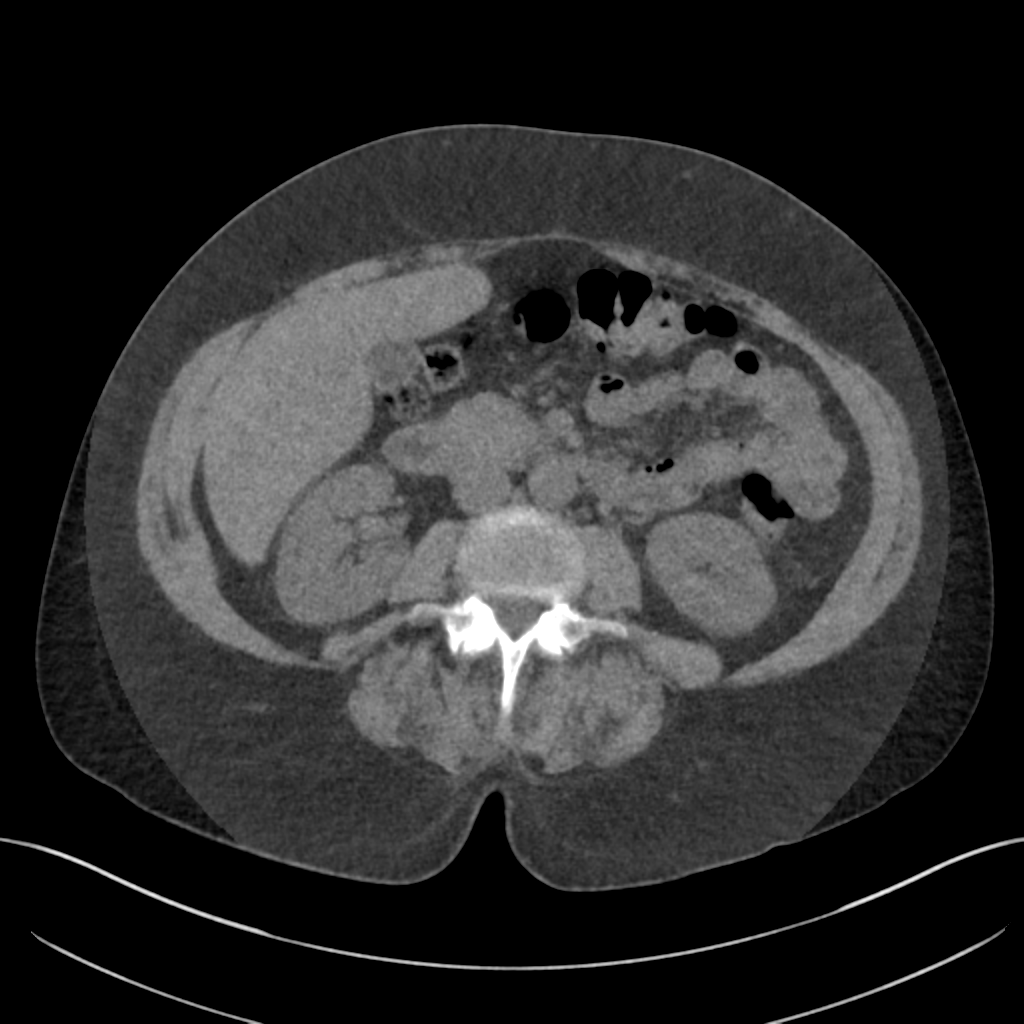
[im 184/213  soft-tissue]
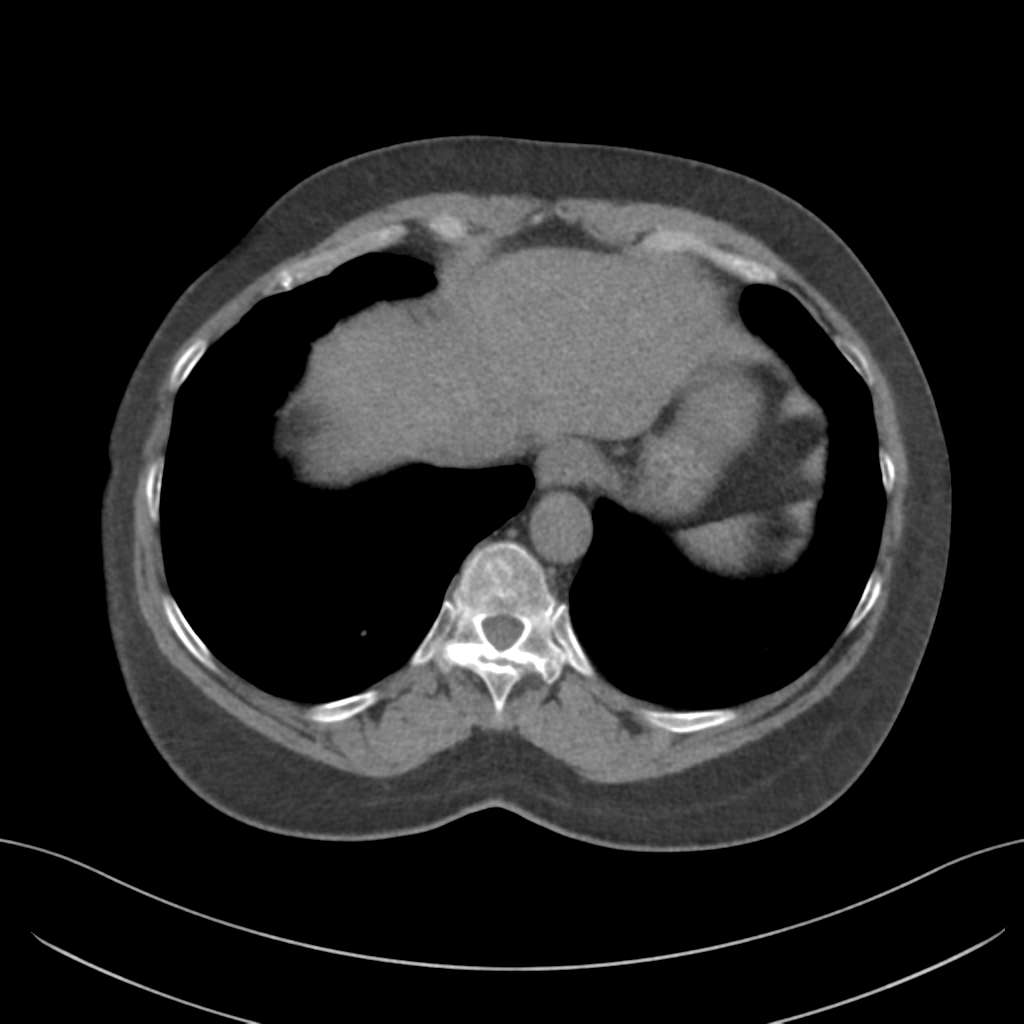

[4 of 46 positions shown; findings below may reference images not displayed]

FINDINGS: Lower chest: No acute abnormality

Hepatobiliary: The liver and gallbladder are unremarkable. No
biliary dilatation.

Pancreas: Unremarkable

Spleen: Unremarkable

Adrenals/Urinary Tract: The kidneys, adrenal glands and bladder are
unremarkable.

Stomach/Bowel: There is focal wall thickening of the distal
descending colon with adjacent inflammation compatible with acute
diverticulitis. No evidence of bowel obstruction, pneumoperitoneum
or abscess. No bowel obstruction identified. No other areas of bowel
wall thickening or inflammation noted. The appendix is normal.

Vascular/Lymphatic: Aortic atherosclerosis. No enlarged abdominal or
pelvic lymph nodes.

Reproductive: Status post hysterectomy. No adnexal masses.

Other: No ascites or abdominal wall hernia.

Musculoskeletal: No acute or suspicious bony abnormalities.
Degenerative changes within the thoracic and lumbar spine again
noted.
IMPRESSION: 1. Focal wall thickening with adjacent inflammation of the distal
descending being: Compatible with acute diverticulitis. No evidence
of bowel obstruction, pneumoperitoneum or abscess.
Correlation with the patient's colon cancer screening history is
recommended. If screening is not up-to-date, appropriate screening
should be considered when clinically able.
2.  Aortic Atherosclerosis ([id number]).
